# Patient Record
Sex: Female | Born: 1980
Health system: Southern US, Community
[De-identification: ages and names within clinical notes are randomized; demographics above are authoritative.]

## PROBLEM LIST (undated history)

## (undated) DIAGNOSIS — F329 Major depressive disorder, single episode, unspecified: Secondary | ICD-10-CM

## (undated) DIAGNOSIS — IMO0002 Reserved for concepts with insufficient information to code with codable children: Secondary | ICD-10-CM

## (undated) DIAGNOSIS — K8020 Calculus of gallbladder without cholecystitis without obstruction: Secondary | ICD-10-CM

## (undated) DIAGNOSIS — Z8659 Personal history of other mental and behavioral disorders: Secondary | ICD-10-CM

## (undated) DIAGNOSIS — K219 Gastro-esophageal reflux disease without esophagitis: Secondary | ICD-10-CM

## (undated) DIAGNOSIS — R198 Other specified symptoms and signs involving the digestive system and abdomen: Secondary | ICD-10-CM

## (undated) DIAGNOSIS — F32A Depression, unspecified: Secondary | ICD-10-CM

## (undated) DIAGNOSIS — Z8719 Personal history of other diseases of the digestive system: Secondary | ICD-10-CM

## (undated) HISTORY — DX: Personal history of other mental and behavioral disorders: Z86.59

## (undated) HISTORY — PX: WISDOM TOOTH EXTRACTION: SHX21

## (undated) HISTORY — DX: Reserved for concepts with insufficient information to code with codable children: IMO0002

## (undated) HISTORY — DX: Other specified symptoms and signs involving the digestive system and abdomen: R19.8

## (undated) HISTORY — DX: Personal history of other diseases of the digestive system: Z87.19

## (undated) HISTORY — DX: Gastro-esophageal reflux disease without esophagitis: K21.9

---
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
## 2006-10-31 DIAGNOSIS — R87619 Unspecified abnormal cytological findings in specimens from cervix uteri: Secondary | ICD-10-CM

## 2006-10-31 DIAGNOSIS — IMO0002 Reserved for concepts with insufficient information to code with codable children: Secondary | ICD-10-CM

## 2006-10-31 HISTORY — DX: Unspecified abnormal cytological findings in specimens from cervix uteri: R87.619

## 2006-10-31 HISTORY — DX: Reserved for concepts with insufficient information to code with codable children: IMO0002

## 2009-05-13 ENCOUNTER — Ambulatory Visit: Payer: Self-pay | Admitting: Family Medicine

## 2009-05-15 ENCOUNTER — Encounter: Payer: Self-pay | Admitting: Family Medicine

## 2011-04-23 ENCOUNTER — Emergency Department (INDEPENDENT_AMBULATORY_CARE_PROVIDER_SITE_OTHER): Payer: No Typology Code available for payment source

## 2011-04-23 ENCOUNTER — Emergency Department (HOSPITAL_BASED_OUTPATIENT_CLINIC_OR_DEPARTMENT_OTHER)
Admission: EM | Admit: 2011-04-23 | Discharge: 2011-04-23 | Disposition: A | Discharge status: Home / Self Care | Payer: No Typology Code available for payment source | Attending: Emergency Medicine | Admitting: Emergency Medicine

## 2011-04-23 DIAGNOSIS — S335XXA Sprain of ligaments of lumbar spine, initial encounter: Secondary | ICD-10-CM | POA: Insufficient documentation

## 2011-04-23 DIAGNOSIS — X58XXXA Exposure to other specified factors, initial encounter: Secondary | ICD-10-CM | POA: Insufficient documentation

## 2011-04-23 DIAGNOSIS — M79609 Pain in unspecified limb: Secondary | ICD-10-CM

## 2011-04-23 DIAGNOSIS — M545 Low back pain, unspecified: Secondary | ICD-10-CM

## 2012-08-28 ENCOUNTER — Encounter: Payer: Self-pay | Admitting: Emergency Medicine

## 2012-08-28 ENCOUNTER — Emergency Department
Admission: EM | Admit: 2012-08-28 | Discharge: 2012-08-28 | Disposition: A | Discharge status: Home / Self Care | Payer: Self-pay | Source: Home / Self Care | Attending: Family Medicine | Admitting: Family Medicine

## 2012-08-28 DIAGNOSIS — M940 Chondrocostal junction syndrome [Tietze]: Secondary | ICD-10-CM

## 2012-08-28 HISTORY — DX: Calculus of gallbladder without cholecystitis without obstruction: K80.20

## 2012-08-28 LAB — POCT CBC W AUTO DIFF (K'VILLE URGENT CARE)

## 2012-08-28 MED ORDER — NAPROXEN 500 MG PO TABS: 500.0000 mg | ORAL_TABLET | Freq: Two times a day (BID) | ORAL | Status: DC

## 2012-08-28 NOTE — ED Provider Notes (Signed)
History     CSN: [id number]  Arrival date & time 08/28/12  1157   First MD Initiated Contact with Patient 08/28/12 1234      Chief Complaint  Patient presents with  . GI Problem     HPI Comments: Patient states she has gall stones and feels like she has gas trapped under her left ribs since yesterday afternoon, no relief with tums or pepcid.  Her pain is constant and sharp, somewhat worse with inspiration.  She has had no nausea/vomiting.  Bowel movements are normal. The pain does not radiate.  She had a similar episode one year ago and EKG and chest X-ray were normal.  Patient is a 31 y.o. female presenting with abdominal pain. The history is provided by the patient.  Abdominal Pain The primary symptoms of the illness include abdominal pain. The primary symptoms of the illness do not include fever, fatigue, shortness of breath, nausea, vomiting, diarrhea, hematemesis, hematochezia, dysuria, vaginal discharge or vaginal bleeding. The current episode started yesterday. The onset of the illness was sudden.  Associated with: nothing. The patient states that she believes she is currently not pregnant. The patient has not had a change in bowel habit. Symptoms associated with the illness do not include chills, anorexia, diaphoresis, heartburn, constipation, urgency, hematuria, frequency or back pain.    Past Medical History  Diagnosis Date  . Gall stones     History reviewed. No pertinent past surgical history.  Family History  Problem Relation Age of Onset  . Migraines Mother   . Diverticulitis Mother     History  Substance Use Topics  . Smoking status: Never Smoker   . Smokeless tobacco: Not on file  . Alcohol Use: No    OB History    Grav Para Term Preterm Abortions TAB SAB Ect Mult Living                  Review of Systems  Constitutional: Negative for fever, chills, diaphoresis and fatigue.  Respiratory: Negative for shortness of breath.   Gastrointestinal: Positive  for abdominal pain. Negative for heartburn, nausea, vomiting, diarrhea, constipation, hematochezia, anorexia and hematemesis.  Genitourinary: Negative for dysuria, urgency, frequency, hematuria, vaginal bleeding and vaginal discharge.  Musculoskeletal: Negative for back pain.  All other systems reviewed and are negative.    Allergies  Review of patient's allergies indicates no known allergies.  Home Medications   Current Outpatient Rx  Name Route Sig Dispense Refill  . CALCIUM CARBONATE ANTACID 500 MG PO CHEW Oral Chew 1 tablet by mouth daily.    Marland Kitchen FAMOTIDINE 10 MG PO CHEW Oral Chew 10 mg by mouth 2 (two) times daily.    Marland Kitchen NAPROXEN 500 MG PO TABS Oral Take 1 tablet (500 mg total) by mouth 2 (two) times daily. (every 12 hours with food) 20 tablet 1    BP 148/84  Pulse 87  Temp 98 F (36.7 C) (Oral)  Resp 16  Ht 5\' 10"  (1.778 m)  Wt 252 lb (114.306 kg)  BMI 36.16 kg/m2  SpO2 99%  Physical Exam Nursing notes and Vital Signs reviewed. Appearance:  Patient appears stated age, and in no acute distress.  Patient is obese (BMI 36.2) Eyes:  Pupils are equal, round, and reactive to light and accomodation.  Extraocular movement is intact.  Conjunctivae are not inflamed  Ears:  Canals normal.  Tympanic membranes normal.  Nose:  Mildly congested turbinates.  No sinus tenderness.   Pharynx:  Normal Neck:  Supple.  No adenopathy Lungs:  Clear to auscultation.  Breath sounds are equal.  Chest:  Distinct tenderness to palpation over the lower sternum and Xiphoid process, extending laterally to left lower anterior rib.  Heart:  Regular rate and rhythm without murmurs, rubs, or gallops.  Abdomen:  Nontender without masses or hepatosplenomegaly.  Bowel sounds are present.  No CVA or flank tenderness.  Extremities:  No edema.  No calf tenderness Skin:  No rash present.   ED Course  Procedures none   Labs Reviewed  POCT CBC W AUTO DIFF ([facility])   WBC 7.6; LY 39.8; MO 5.6; GR  54.6; Hgb 13.6; Platelets 338       1. Costochondritis, acute.  Sudden onset costochondritis could represent and early viral URI       MDM  Rx for Naproxen Apply ice pack several times daily. Given a Water quality scientist patient information and instruction sheet on topic  If cold symptoms develop, take Mucinex D (guaifenesin with decongestant) twice daily for congestion.  Increase fluid intake, rest. May use Afrin nasal spray (or generic oxymetazoline) twice daily for about 5 days.  Also recommend using saline nasal spray several times daily and saline nasal irrigation (AYR is a common brand) Stop all antihistamines for now, and other non-prescription cough/cold preparations. Follow-up with family doctor if not improving 7 to 10 days.          Lattie Haw, MD 08/29/12 [phone]

## 2012-08-28 NOTE — ED Notes (Signed)
Patient states she has gall stones and feels like she has gas trapped under her left ribs since yesterday afternoon, no relief with tums or pepcid

## 2012-08-30 ENCOUNTER — Telehealth: Payer: Self-pay | Admitting: *Deleted

## 2013-01-08 ENCOUNTER — Ambulatory Visit (INDEPENDENT_AMBULATORY_CARE_PROVIDER_SITE_OTHER): Payer: PRIVATE HEALTH INSURANCE | Admitting: Family Medicine

## 2013-01-08 ENCOUNTER — Encounter: Payer: Self-pay | Admitting: Family Medicine

## 2013-01-08 VITALS — BP 136/86 | HR 76 | Ht 69.75 in | Wt 248.0 lb

## 2013-01-08 DIAGNOSIS — K219 Gastro-esophageal reflux disease without esophagitis: Secondary | ICD-10-CM

## 2013-01-08 DIAGNOSIS — R1013 Epigastric pain: Secondary | ICD-10-CM

## 2013-01-08 DIAGNOSIS — R635 Abnormal weight gain: Secondary | ICD-10-CM

## 2013-01-08 DIAGNOSIS — R198 Other specified symptoms and signs involving the digestive system and abdomen: Secondary | ICD-10-CM

## 2013-01-08 DIAGNOSIS — Z8719 Personal history of other diseases of the digestive system: Secondary | ICD-10-CM

## 2013-01-08 DIAGNOSIS — G8929 Other chronic pain: Secondary | ICD-10-CM

## 2013-01-08 HISTORY — DX: Personal history of other diseases of the digestive system: Z87.19

## 2013-01-08 HISTORY — DX: Other specified symptoms and signs involving the digestive system and abdomen: R19.8

## 2013-01-08 MED ORDER — DEXLANSOPRAZOLE 60 MG PO CPDR: 60.0000 mg | DELAYED_RELEASE_CAPSULE | Freq: Every day | ORAL | Status: DC

## 2013-01-08 NOTE — Patient Instructions (Addendum)
Psyllium powder for bowel irregularity: 3 heaping TEASPOONS spread out over the day for two weeks,continue beyond this if helping "IBS" symptoms. Start with one teaspoon a day, then two, then three. Phentermine tablets or capsules What is this medicine? PHENTERMINE (FEN ter meen) decreases your appetite. It is used with a reduced calorie diet and exercise to help you lose weight. This medicine may be used for other purposes; ask your health care Jeryn Bertoni or pharmacist if you have questions. What should I tell my health care Yardley Beltran before I take this medicine? They need to know if you have any of these conditions: -agitation -glaucoma -heart disease -high blood pressure -history of substance abuse -lung disease called Primary Pulmonary Hypertension (PPH) -taken an MAOI like Carbex, Eldepryl, Marplan, Nardil, or Parnate in last 14 days -thyroid disease -an unusual or allergic reaction to phentermine, other medicines, foods, dyes, or preservatives -pregnant or trying to get pregnant -breast-feeding How should I use this medicine? Take this medicine by mouth with a glass of water. Follow the directions on the prescription label. This medicine is usually taken 30 minutes before or 1 to 2 hours after breakfast. Avoid taking this medicine in the evening. It may interfere with sleep. Take your doses at regular intervals. Do not take your medicine more often than directed. Talk to your pediatrician regarding the use of this medicine in children. Special care may be needed. Overdosage: If you think you have taken too much of this medicine contact a poison control center or emergency room at once. NOTE: This medicine is only for you. Do not share this medicine with others. What if I miss a dose? If you miss a dose, take it as soon as you can. If it is almost time for your next dose, take only that dose. Do not take double or extra doses. What may interact with this medicine? Do not take this medicine  with any of the following medications: -duloxetine -MAOIs like Carbex, Eldepryl, Marplan, Nardil, and Parnate -medicines for colds or breathing difficulties like pseudoephedrine or phenylephrine -procarbazine -sibutramine -SSRIs like citalopram, escitalopram, fluoxetine, fluvoxamine, paroxetine, and sertraline -stimulants like dexmethylphenidate, methylphenidate or modafinil -venlafaxine This medicine may also interact with the following medications: -medicines for diabetes This list may not describe all possible interactions. Give your health care Rebekah Sprinkle a list of all the medicines, herbs, non-prescription drugs, or dietary supplements you use. Also tell them if you smoke, drink alcohol, or use illegal drugs. Some items may interact with your medicine. What should I watch for while using this medicine? Notify your physician immediately if you become short of breath while doing your normal activities. Do not take this medicine within 6 hours of bedtime. It can keep you from getting to sleep. Avoid drinks that contain caffeine and try to stick to a regular bedtime every night. This medicine was intended to be used in addition to a healthy diet and exercise. The best results are achieved this way. This medicine is only indicated for short-term use. Eventually your weight loss may level out. At that point, the drug will only help you maintain your new weight. Do not increase or in any way change your dose without consulting your doctor. You may get drowsy or dizzy. Do not drive, use machinery, or do anything that needs mental alertness until you know how this medicine affects you. Do not stand or sit up quickly, especially if you are an older patient. This reduces the risk of dizzy or fainting spells. Alcohol may  increase dizziness and drowsiness. Avoid alcoholic drinks. What side effects may I notice from receiving this medicine? Side effects that you should report to your doctor or health care  professional as soon as possible: -chest pain, palpitations -depression or severe changes in mood -increased blood pressure -irritability -nervousness or restlessness -severe dizziness -shortness of breath -problems urinating -unusual swelling of the legs -vomiting Side effects that usually do not require medical attention (report to your doctor or health care professional if they continue or are bothersome): -blurred vision or other eye problems -changes in sexual ability or desire -constipation or diarrhea -difficulty sleeping -dry mouth or unpleasant taste -headache -nausea This list may not describe all possible side effects. Call your doctor for medical advice about side effects. You may report side effects to FDA at 1-800-FDA-1088. Where should I keep my medicine? Keep out of the reach of children. This medicine can be abused. Keep your medicine in a safe place to protect it from theft. Do not share this medicine with anyone. Selling or giving away this medicine is dangerous and against the law. Store at room temperature between 20 and 25 degrees C (68 and 77 degrees F). Keep container tightly closed. Throw away any unused medicine after the expiration date. NOTE: This sheet is a summary. It may not cover all possible information. If you have questions about this medicine, talk to your doctor, pharmacist, or health care Antwione Picotte.  2012, [employer]. (12/01/2010 11:02:44 AM)

## 2013-01-08 NOTE — Progress Notes (Signed)
CC: Katherine [name] is a 32 y.o. female is here for Establish Care, weight loss issues and possible reflux   Subjective: HPI:  Katherine [name] 32 year old here to establish care  Patient complains of inability to significantly lose weight ever since November of 2013. At that time she started Weight Watchers and has been following with strict adherence and reports only losing 3 pounds. Prior to this regimen she was eating out 5 nights a week. She has a limited fast foods and fried foods, eating a diet heavy in vegetables, fruits, and lean meat. Her inability to lose weight has been persistent since November, nothing seems to make this inability better or worse, she tries to stay active but no formal exercise program. She has never had this problem before. Mother has hypothyroidism.   Patient complains of over 3 months of epigastric pain. It is present most days of the week and described as a mild to moderate burning that has a burning radiation of the back of her sternum. It is worse with dairy products. It is slightly improved with TUMS but only temporarily. She denies right upper quadrant pain or right scapular pain, however she has had this before which prompted a ED visit when they found gallstones. Symptoms occur on a daily basis.  Patient complains of bowel irregularities that have been present for 6-12 months. She describes a unpredictable three-day cycle of constipation followed by 3 days of loose stools which may or may not repeat itself the following 3-6 days. It is associated with bloating in the abdomen localized low in the abdomen that is relieved with defecation. She denies nausea or vomiting prior during or after the above cycles. She denies unintentional weight loss nor waking with abdominal pain. She denies light stools nor tar-like stools. No interventions as of yet. Symptoms are described as moderate with respect to interfering with quality of life. Symptoms can occur after eating any  type of food or even with dietary restrictions from dairy or vegetables. Nothing seems to make symptoms better or worse.  Review of Systems - General ROS: negative for - chills, fever, night sweats, Ophthalmic ROS: negative for - decreased vision Psychological ROS: negative for - anxiety or depression ENT ROS: negative for - hearing change, nasal congestion, tinnitus or allergies Hematological and Lymphatic ROS: negative for - bleeding problems, bruising or swollen lymph nodes Breast ROS: negative Respiratory ROS: no cough, shortness of breath, or wheezing Cardiovascular ROS: no chest pain or dyspnea on exertion Genito-Urinary ROS: negative for - genital discharge, genital ulcers, incontinence or abnormal bleeding from genitals Musculoskeletal ROS: negative for - joint pain or muscle pain Neurological ROS: negative for - headaches or memory loss Dermatological ROS: negative for lumps, mole changes, rash and skin lesion changes  Past Medical History  Diagnosis Date  . Gall stones   . History of gallstones 01/08/2013     Family History  Problem Relation Age of Onset  . Migraines Mother   . Diverticulitis Mother   . Alcoholism      grandparent  . Lung cancer      grandparent  . Depression Mother   . Diabetes      aunt  . Hyperlipidemia Mother   . Hypertension Mother   . Stroke      greatgrandmother     History  Substance Use Topics  . Smoking status: Former Games developer  . Smokeless tobacco: Not on file  . Alcohol Use: No     Objective: Filed Vitals:  01/08/13 1423  BP: 136/86  Pulse: 76    General: Alert and Oriented, No Acute Distress HEENT: Pupils equal, round, reactive to light. Conjunctivae clear.  External ears unremarkable, canals clear with intact TMs with appropriate landmarks.  Middle ear appears open without effusion. Pink inferior turbinates.  Moist mucous membranes, pharynx without inflammation nor lesions.  Neck supple without palpable lymphadenopathy nor  abnormal masses. Lungs: Clear to auscultation bilaterally, no wheezing/ronchi/rales.  Comfortable work of breathing. Good air movement. Cardiac: Regular rate and rhythm. Normal S1/S2.  No murmurs, rubs, nor gallops.   Abdomen: Normal bowel sounds, soft and no palpable masses nor hepatosplenomegaly, epigastric pain is reproduced with moderate palpation just below the xiphoid process, no pain elsewhere in the abdomen  Extremities: No peripheral edema.  Strong peripheral pulses.  Mental Status: No depression, anxiety, nor agitation. Skin: Warm and dry.  Assessment & Plan: Carren was seen today for establish care, weight loss issues and possible reflux.  Diagnoses and associated orders for this visit:  History of gallstones  Weight gain - TSH - T4, free - T3, free - COMPLETE METABOLIC PANEL WITH GFR  Alternating constipation and diarrhea  Abdominal pain, chronic, epigastric - H. pylori antibody, IgG - COMPLETE METABOLIC PANEL WITH GFR  GERD (gastroesophageal reflux disease) - dexlansoprazole (DEXILANT) 60 MG capsule; Take 1 capsule (60 mg total) by mouth daily.    Weight gain: Will rule out thyroid abnormality or metabolic abnormalities, if normal patient is interested in considering phentermine or Topamax and will return to discuss risks and benefits Alternating constipation and diarrhea: Discussed suspicion for irritable bowel syndrome therefore start psyllum supplementation for increased fiber intake.  Epigastric abdominal pain: High suspicion for GERD, patient provided with samples of PPI for therapeutic and diagnostic evaluation and also checking hpylori antigen   Return in about 2 weeks (around 01/22/2013).

## 2013-01-09 ENCOUNTER — Encounter: Payer: Self-pay | Admitting: Family Medicine

## 2013-01-09 LAB — TSH: TSH: 3.377 u[IU]/mL (ref 0.350–4.500)

## 2013-01-09 LAB — COMPLETE METABOLIC PANEL WITH GFR
AST: 14 U/L (ref 0–37)
Albumin: 4.5 g/dL (ref 3.5–5.2)
Alkaline Phosphatase: 73 U/L (ref 39–117)
Potassium: 4.2 mEq/L (ref 3.5–5.3)
Sodium: 138 mEq/L (ref 135–145)
Total Bilirubin: 0.4 mg/dL (ref 0.3–1.2)
Total Protein: 6.9 g/dL (ref 6.0–8.3)

## 2013-01-09 LAB — T3, FREE: T3, Free: 2.9 pg/mL (ref 2.3–4.2)

## 2013-01-09 LAB — H. PYLORI ANTIBODY, IGG: H Pylori IgG: <0.4 {ISR}

## 2013-01-22 ENCOUNTER — Encounter: Payer: Self-pay | Admitting: Family Medicine

## 2013-01-22 ENCOUNTER — Ambulatory Visit (INDEPENDENT_AMBULATORY_CARE_PROVIDER_SITE_OTHER): Payer: PRIVATE HEALTH INSURANCE | Admitting: Family Medicine

## 2013-01-22 VITALS — BP 135/85 | HR 80 | Wt 253.0 lb

## 2013-01-22 DIAGNOSIS — R198 Other specified symptoms and signs involving the digestive system and abdomen: Secondary | ICD-10-CM

## 2013-01-22 DIAGNOSIS — E669 Obesity, unspecified: Secondary | ICD-10-CM | POA: Insufficient documentation

## 2013-01-22 DIAGNOSIS — R030 Elevated blood-pressure reading, without diagnosis of hypertension: Secondary | ICD-10-CM

## 2013-01-22 DIAGNOSIS — K219 Gastro-esophageal reflux disease without esophagitis: Secondary | ICD-10-CM

## 2013-01-22 DIAGNOSIS — IMO0001 Reserved for inherently not codable concepts without codable children: Secondary | ICD-10-CM

## 2013-01-22 DIAGNOSIS — R635 Abnormal weight gain: Secondary | ICD-10-CM

## 2013-01-22 HISTORY — DX: Gastro-esophageal reflux disease without esophagitis: K21.9

## 2013-01-22 MED ORDER — PHENTERMINE HCL 37.5 MG PO CAPS: 37.5000 mg | ORAL_CAPSULE | ORAL | Status: DC

## 2013-01-22 NOTE — Progress Notes (Signed)
CC: Katherine [name] is a 32 y.o. female is here for GERD f/u and discuss labs   Subjective: HPI:  Followup obesity and weight gain: Patient reports continued trouble with losing weight despite using weight watchers and physical activity. Recent labs did not show metabolic abnormalities. She has been doing research on phentermine after our last discussion and has an interest. Nothing seems to help with the weight loss, seems to be worse with lack of portion control. Trouble has been present for years.  Followup epigastric pain: There was a suspicion for GERD and she was started on dexilant. She reports complete resolution of her symptoms soon after starting dexilant. If she misses a day or 2 dosing symptoms slowly returned. She is quite happy with the response.  Followup diarrhea and constipation: Since starting 2 teaspoons of psyllum powder on a daily basis she reports predictable and consistent single nonpainful well formed bowel movements. She tells me her initial complaints are gone.  Pointed out the patient that she has had pre-hypertension at her past visits and today. She informs me she is been told this before and takes her blood pressure regularly at home with a biceps cuff. She reports consistent blood pressures below 130/80  She denies chest pain, shortness of breath, motor sensory disturbances, mental disturbance, irregular heartbeat  Review Of Systems Outlined In HPI  Past Medical History  Diagnosis Date  . Gall stones   . History of gallstones 01/08/2013  . GERD (gastroesophageal reflux disease) 01/22/2013  . Alternating constipation and diarrhea 01/08/2013     Family History  Problem Relation Age of Onset  . Migraines Mother   . Diverticulitis Mother   . Alcoholism      grandparent  . Lung cancer      grandparent  . Depression Mother   . Diabetes      aunt  . Hyperlipidemia Mother   . Hypertension Mother   . Stroke      greatgrandmother     History   Substance Use Topics  . Smoking status: Former Games developer  . Smokeless tobacco: Not on file  . Alcohol Use: No     Objective: Filed Vitals:   01/22/13 1559  BP: 135/85  Pulse: 80    Vital signs reviewed. General: Alert and Oriented, No Acute Distress HEENT: Pupils equal, round, reactive to light. Conjunctivae clear.  External ears unremarkable.  Moist mucous membranes. Lungs: Clear and comfortable work of breathing, speaking in full sentences without accessory muscle use. No wheezing rhonchi nor rales Cardiac: Regular rate and rhythm. No murmurs Neuro: CN II-XII grossly intact, gait normal. Extremities: No peripheral edema.  Strong peripheral pulses.  Mental Status: No depression, anxiety, nor agitation. Logical though process. Skin: Warm and dry.  Assessment & Plan: Greydis was seen today for gerd f/u and discuss labs.  Diagnoses and associated orders for this visit:  Weight gain  Alternating constipation and diarrhea  Obesity - phentermine 37.5 MG capsule; Take 1 capsule (37.5 mg total) by mouth every morning.  GERD (gastroesophageal reflux disease)  Elevated BP    Weight gain and obesity: Uncontrolled, discussed risk and benefits of phentermine use. She will start and return in 4 months for blood pressure and weight check   alternating constipation and diarrhea: Suspicion of IBS now resolved and controlled on psyllum powder continue current regimen GERD: Controlled, will need to switch to other PPI due to cost issues, she will notify me which one is most financially reasonable Elevated BP: Discussed that  we may need to stop phentermine in the future if this is worsened, she agrees to take blood pressures at home frequently notify me if greater than 140/90 bring in blood pressure cuff next visit  25 minutes spent face-to-face during visit today of which at least 50% was counseling or coordinating care regarding weight gain and obesity, GERD, elevated BP, IBS.   Return  in about 4 weeks (around 02/19/2013).

## 2013-01-22 NOTE — Patient Instructions (Signed)
Dexilant, Protonix (Pantoprazole), Nexium (Esomeprazole), Aciphex (Rabeprazole), Prevacid (Lansoprazole)

## 2013-01-28 ENCOUNTER — Telehealth: Payer: Self-pay | Admitting: Family Medicine

## 2013-01-28 ENCOUNTER — Encounter: Payer: Self-pay | Admitting: Family Medicine

## 2013-01-28 DIAGNOSIS — K219 Gastro-esophageal reflux disease without esophagitis: Secondary | ICD-10-CM

## 2013-01-28 MED ORDER — PANTOPRAZOLE SODIUM 40 MG PO TBEC: 40.0000 mg | DELAYED_RELEASE_TABLET | Freq: Every day | ORAL | Status: DC

## 2013-01-28 NOTE — Telephone Encounter (Signed)
Katherine [name], Will you please let Katherine [name] know I sent in protonix to her [facility].

## 2013-01-28 NOTE — Telephone Encounter (Signed)
Pt.notified

## 2013-02-19 ENCOUNTER — Ambulatory Visit (INDEPENDENT_AMBULATORY_CARE_PROVIDER_SITE_OTHER): Payer: PRIVATE HEALTH INSURANCE | Admitting: Family Medicine

## 2013-02-19 ENCOUNTER — Encounter: Payer: Self-pay | Admitting: Family Medicine

## 2013-02-19 VITALS — BP 133/84 | HR 79 | Wt 239.0 lb

## 2013-02-19 DIAGNOSIS — IMO0001 Reserved for inherently not codable concepts without codable children: Secondary | ICD-10-CM

## 2013-02-19 DIAGNOSIS — K219 Gastro-esophageal reflux disease without esophagitis: Secondary | ICD-10-CM

## 2013-02-19 DIAGNOSIS — R030 Elevated blood-pressure reading, without diagnosis of hypertension: Secondary | ICD-10-CM

## 2013-02-19 DIAGNOSIS — E669 Obesity, unspecified: Secondary | ICD-10-CM

## 2013-02-19 MED ORDER — PHENTERMINE HCL 37.5 MG PO CAPS: 37.5000 mg | ORAL_CAPSULE | ORAL | Status: DC

## 2013-02-19 NOTE — Progress Notes (Signed)
CC: Katherine [name] is a 32 y.o. female is here for Weight Check and Blood Pressure Check   Subjective: HPI:  Followup obesity: Has started phentermine on a daily basis without any anxiety, sleep disturbance, anorexia, mental disturbance, tremor, irregular heartbeat, nor chest pain. Has successfully lost 14 pounds with portion control and keeping a food diary on my fitness pal.  Husband is helping out as well, he has lost 15 pounds without phentermine use.  Followup elevated blood pressure: She has had her blood pressure taken at her office and reports consistent blood pressures below 120/80 since starting phentermine. Denies motor or sensory disturbances, chest pain, orthopnea, peripheral edema.  Followup GERD: Continues on dexilant samples without any discomfort in the throat, abdomen, esophagus. Continues to feel that GERD symptoms from the past are 100% absent.   Review Of Systems Outlined In HPI  Past Medical History  Diagnosis Date  . Gall stones   . History of gallstones 01/08/2013  . GERD (gastroesophageal reflux disease) 01/22/2013  . Alternating constipation and diarrhea 01/08/2013     Family History  Problem Relation Age of Onset  . Migraines Mother   . Diverticulitis Mother   . Alcoholism      grandparent  . Lung cancer      grandparent  . Depression Mother   . Diabetes      aunt  . Hyperlipidemia Mother   . Hypertension Mother   . Stroke      greatgrandmother     History  Substance Use Topics  . Smoking status: Former Games developer  . Smokeless tobacco: Not on file  . Alcohol Use: No     Objective: Filed Vitals:   02/19/13 1619  BP: 133/84  Pulse: 79    Vital signs reviewed. General: Alert and Oriented, No Acute Distress HEENT: Pupils equal, round, reactive to light. Conjunctivae clear.  External ears unremarkable.  Moist mucous membranes. Lungs: Clear and comfortable work of breathing, speaking in full sentences without accessory muscle use. No  wheezing rhonchi nor rales Cardiac: Regular rate and rhythm. No murmurs rubs or gallops Neuro: CN II-XII grossly intact, gait normal. Extremities: No peripheral edema.  Strong peripheral pulses.  Mental Status: No depression, anxiety, nor agitation. Logical though process. Skin: Warm and dry.  Assessment & Plan: Katherine [name] was seen today for weight check and blood pressure check.  Diagnoses and associated orders for this visit:  Obesity - phentermine 37.5 MG capsule; Take 1 capsule (37.5 mg total) by mouth every morning.  GERD (gastroesophageal reflux disease)  Elevated BP    Obesity: Improving continue phentermine use and calorie counting GERD: Improved and stable, continue dexilant and start Protonix once out Elevated blood pressure: Well-controlled now with diet and exercise interventions, no formal diagnosis of essential hypertension  Return in about 4 weeks (around 03/19/2013).

## 2013-03-22 ENCOUNTER — Encounter: Payer: Self-pay | Admitting: Family Medicine

## 2013-03-22 ENCOUNTER — Ambulatory Visit: Payer: PRIVATE HEALTH INSURANCE | Admitting: Family Medicine

## 2013-03-22 ENCOUNTER — Ambulatory Visit (INDEPENDENT_AMBULATORY_CARE_PROVIDER_SITE_OTHER): Payer: PRIVATE HEALTH INSURANCE | Admitting: Family Medicine

## 2013-03-22 VITALS — BP 117/70 | HR 92 | Wt 225.0 lb

## 2013-03-22 DIAGNOSIS — K219 Gastro-esophageal reflux disease without esophagitis: Secondary | ICD-10-CM

## 2013-03-22 DIAGNOSIS — E669 Obesity, unspecified: Secondary | ICD-10-CM

## 2013-03-22 MED ORDER — PHENTERMINE HCL 37.5 MG PO CAPS: 37.5000 mg | ORAL_CAPSULE | ORAL | Status: DC

## 2013-03-22 NOTE — Progress Notes (Signed)
CC: Katherine [name] is a 32 y.o. female is here for f/u wt wt loss   Subjective: HPI:  Followup obesity: Continues to exercise most days of the week, successful with portion control, no longer snacking as much. She is pleased with her weight loss. Denies irregular heartbeat, chest pain, paranoia, insomnia, mental disturbance, tremor.  Followup GERD: Switch to Protonix at her last visit for financial reasons. Denies epigastric pain, abdominal pain, GI disturbance.     Review Of Systems Outlined In HPI  Past Medical History  Diagnosis Date  . Gall stones   . History of gallstones 01/08/2013  . GERD (gastroesophageal reflux disease) 01/22/2013  . Alternating constipation and diarrhea 01/08/2013     Family History  Problem Relation Age of Onset  . Migraines Mother   . Diverticulitis Mother   . Alcoholism      grandparent  . Lung cancer      grandparent  . Depression Mother   . Diabetes      aunt  . Hyperlipidemia Mother   . Hypertension Mother   . Stroke      greatgrandmother     History  Substance Use Topics  . Smoking status: Former Games developer  . Smokeless tobacco: Not on file  . Alcohol Use: No     Objective: Filed Vitals:   03/22/13 0822  BP: 117/70  Pulse: 92    Vital signs reviewed. General: Alert and Oriented, No Acute Distress HEENT: Pupils equal, round, reactive to light. Conjunctivae clear.  External ears unremarkable.  Moist mucous membranes. Lungs: Clear and comfortable work of breathing, speaking in full sentences without accessory muscle use. Cardiac: Regular rate and rhythm.  No murmurs rubs or gallops Neuro: CN II-XII grossly intact, gait normal. Extremities: No peripheral edema.  Strong peripheral pulses.  Mental Status: No depression, anxiety, nor agitation. Logical though process. Skin: Warm and dry.  Assessment & Plan: Katherine [name] was seen today for f/u wt wt loss.  Diagnoses and associated orders for this visit:  Obesity - phentermine 37.5  MG capsule; Take 1 capsule (37.5 mg total) by mouth every morning.  GERD (gastroesophageal reflux disease)    Obesity: Improving, applauded her lifestyle and diet interventions, continue phentermine GERD: Controlled, continue Protonix  Return in about 4 weeks (around 04/19/2013) for weight check.

## 2013-04-26 ENCOUNTER — Encounter: Payer: Self-pay | Admitting: Family Medicine

## 2013-04-26 ENCOUNTER — Ambulatory Visit (INDEPENDENT_AMBULATORY_CARE_PROVIDER_SITE_OTHER): Payer: PRIVATE HEALTH INSURANCE | Admitting: Family Medicine

## 2013-04-26 VITALS — BP 118/85 | HR 84 | Temp 97.9°F | Resp 18 | Wt 217.0 lb

## 2013-04-26 DIAGNOSIS — E669 Obesity, unspecified: Secondary | ICD-10-CM

## 2013-04-26 MED ORDER — PHENTERMINE HCL 37.5 MG PO CAPS: 37.5000 mg | ORAL_CAPSULE | ORAL | Status: DC

## 2013-04-26 NOTE — Progress Notes (Signed)
CC: Katherine [name] is a 32 y.o. female is here for Weight Check   Subjective: HPI:  Followup obesity: Patient reports subjective improvement with weight loss, she reports sustained improvement with portion control and healthy food choices. She is engaging in exercise most days of the week. She is trying to eat healthier less fatty foods and lower glycemic index  Foods.  She reports success with decreased appetite. Portion control and appetite habits are worsened when she's menstruating Or on the weekends, these are improved when she gets into her usual daily routine. She denies anxiety, tremor, mental disturbance, sleep disturbance, insomnia rapid heartbeat or chest pain.   Review Of Systems Outlined In HPI  Past Medical History  Diagnosis Date  . Gall stones   . History of gallstones 01/08/2013  . GERD (gastroesophageal reflux disease) 01/22/2013  . Alternating constipation and diarrhea 01/08/2013     Family History  Problem Relation Age of Onset  . Migraines Mother   . Diverticulitis Mother   . Alcoholism      grandparent  . Lung cancer      grandparent  . Depression Mother   . Diabetes      aunt  . Hyperlipidemia Mother   . Hypertension Mother   . Stroke      greatgrandmother     History  Substance Use Topics  . Smoking status: Former Games developer  . Smokeless tobacco: Not on file  . Alcohol Use: No     Objective: Filed Vitals:   04/26/13 0926  BP: 118/85  Pulse: 84  Temp: 97.9 F (36.6 C)  Resp: 18    Vital signs reviewed. General: Alert and Oriented, No Acute Distress HEENT: Pupils equal, round, reactive to light. Conjunctivae clear.  External ears unremarkable.  Moist mucous membranes. Lungs: Clear and comfortable work of breathing, speaking in full sentences without accessory muscle use. Cardiac: Regular rate and rhythm.  Neuro: CN II-XII grossly intact, gait normal. Extremities: No peripheral edema.  Strong peripheral pulses.  Mental Status: No  depression, anxiety, nor agitation. Logical though process. Skin: Warm and dry. Assessment & Plan: Katherine [name] was seen today for weight check.  Diagnoses and associated orders for this visit:  Obesity - phentermine 37.5 MG capsule; Take 1 capsule (37.5 mg total) by mouth every morning.    Obesity: Controlled and improving, blood pressure has also improved since first visit, continue phentermine congratulated her on diet and exercise interventions that she's been sticking to  Return in about 4 weeks (around 05/24/2013).

## 2013-05-23 ENCOUNTER — Ambulatory Visit (INDEPENDENT_AMBULATORY_CARE_PROVIDER_SITE_OTHER): Payer: PRIVATE HEALTH INSURANCE | Admitting: Family Medicine

## 2013-05-23 ENCOUNTER — Encounter: Payer: Self-pay | Admitting: Family Medicine

## 2013-05-23 VITALS — BP 117/79 | HR 78 | Wt 217.0 lb

## 2013-05-23 DIAGNOSIS — E669 Obesity, unspecified: Secondary | ICD-10-CM

## 2013-05-23 MED ORDER — PHENTERMINE HCL 37.5 MG PO CAPS: 37.5000 mg | ORAL_CAPSULE | ORAL | Status: DC

## 2013-05-23 NOTE — Progress Notes (Signed)
CC: Katherine [name] is a 32 y.o. female is here for bp and wt check   Subjective: HPI:   Followup obesity: Patient stopped phentermine about 3 weeks ago for 2 weeks to see if she could maintain her weight this was after gaining 6 pounds after going to the beach or on fourth of July. Over this 2 weeks there was no weight change, she restarted phentermine was able to lose to 6 pounds she gained earlier in the month. She's been unable to work out due to job responsibilities, she continues to focus on portion control and wise eating. She denies irregular heartbeat, chest pain, mental disturbance, anxiety, sleep disturbance  Review Of Systems Outlined In HPI  Past Medical History  Diagnosis Date  . Gall stones   . History of gallstones 01/08/2013  . GERD (gastroesophageal reflux disease) 01/22/2013  . Alternating constipation and diarrhea 01/08/2013     Family History  Problem Relation Age of Onset  . Migraines Mother   . Diverticulitis Mother   . Alcoholism      grandparent  . Lung cancer      grandparent  . Depression Mother   . Diabetes      aunt  . Hyperlipidemia Mother   . Hypertension Mother   . Stroke      greatgrandmother     History  Substance Use Topics  . Smoking status: Former Games developer  . Smokeless tobacco: Not on file  . Alcohol Use: No     Objective: Filed Vitals:   05/23/13 0844  BP: 117/79  Pulse: 78   Vital signs reviewed. General: Alert and Oriented, No Acute Distress HEENT: Pupils equal, round, reactive to light. Conjunctivae clear.  External ears unremarkable.  Moist mucous membranes. Lungs: Clear and comfortable work of breathing, speaking in full sentences without accessory muscle use. Cardiac: Regular rate and rhythm. No murmurs rubs or gallops Neuro: CN II-XII grossly intact, gait normal. Extremities: No peripheral edema.  Strong peripheral pulses.  Mental Status: No depression, anxiety, nor agitation. Logical though process. Skin: Warm and  dry.  Assessment & Plan: Amayrani was seen today for bp and wt check.  Diagnoses and associated orders for this visit:  Obesity - phentermine 37.5 MG capsule; Take 1 capsule (37.5 mg total) by mouth every morning.    Obesity: Improving on phentermine, continue phentermine she will be losing insurance for the next 45 days given her success and tolerance on phentermine will provide her with 60 day prescription she agrees to call in 30 days from now with her weight at home.  Return in about 2 months (around 07/24/2013).

## 2013-07-12 ENCOUNTER — Telehealth: Payer: Self-pay | Admitting: *Deleted

## 2013-07-12 NOTE — Telephone Encounter (Signed)
Don't be too discouraged everyone hits a plateau with phentermine at some point, there are other medications that are available but are much more expensive, we can go over options, risks, and benefits when she follows up.

## 2013-07-12 NOTE — Telephone Encounter (Signed)
Pt.notified

## 2013-07-12 NOTE — Telephone Encounter (Signed)
Pt called to give an update on her wt. She states she called in Aug ( but I dont see any documentation) Pt states she has not lost any wt since July. Pt wanted to know what else she could do or try. I did tell her that sometimes you can hit a plateau with your wt on that medication. Pt wants any ideas or suggestions that you could provide. She will schedule appt sometime this month

## 2013-08-12 ENCOUNTER — Encounter: Payer: Self-pay | Admitting: Family Medicine

## 2013-08-12 ENCOUNTER — Ambulatory Visit (INDEPENDENT_AMBULATORY_CARE_PROVIDER_SITE_OTHER): Payer: BC Managed Care – PPO | Admitting: Family Medicine

## 2013-08-12 VITALS — BP 138/89 | HR 82 | Wt 221.0 lb

## 2013-08-12 DIAGNOSIS — R41840 Attention and concentration deficit: Secondary | ICD-10-CM

## 2013-08-12 DIAGNOSIS — E669 Obesity, unspecified: Secondary | ICD-10-CM

## 2013-08-12 MED ORDER — LORCASERIN HCL 10 MG PO TABS: 10.0000 mg | ORAL_TABLET | Freq: Two times a day (BID) | ORAL | Status: DC

## 2013-08-12 NOTE — Progress Notes (Signed)
CC: Katherine [name] is a 32 y.o. female is here for Medication Problem   Subjective: HPI:  Patient complains of difficulty losing weight despite exercising most days of the week and with counting calories. She noticed during her last month of phentermine use she lost absolutely no weight, still had success with portion control. She denies depression, anxiety, nor mental disturbance. Obesity has been present for over 6 years, improved with phentermine and exercise, worse during times of stress with overeating. Difficulty losing weight has been present on a daily basis for the last 6 weeks. Thyroid has been checked months ago was normal.  Denies fatigue, constipation, memory loss, brittle nails, loss of hair, skin changes  Patient expresses concern that she has difficulty concentrating at home to a moderate severity and mild severity while at work. This is been present for years. She reports her mother always defined her as hyperactive as a child. Patient reports avoiding tasks and responsibilities if they appear to be complex because she knows she will have difficulty completing them. Symptoms have been persistent for the past years nothing particularly makes better or worse she wonders if she has adult ADD.   Review Of Systems Outlined In HPI  Past Medical History  Diagnosis Date  . Gall stones   . History of gallstones 01/08/2013  . GERD (gastroesophageal reflux disease) 01/22/2013  . Alternating constipation and diarrhea 01/08/2013     Family History  Problem Relation Age of Onset  . Migraines Mother   . Diverticulitis Mother   . Alcoholism      grandparent  . Lung cancer      grandparent  . Depression Mother   . Diabetes      aunt  . Hyperlipidemia Mother   . Hypertension Mother   . Stroke      greatgrandmother     History  Substance Use Topics  . Smoking status: Former Games developer  . Smokeless tobacco: Not on file  . Alcohol Use: No     Objective: Filed Vitals:   08/12/13 0943  BP: 138/89  Pulse: 82    Vital signs reviewed. General: Alert and Oriented, No Acute Distress HEENT: Pupils equal, round, reactive to light. Conjunctivae clear.  External ears unremarkable.  Moist mucous membranes. Lungs: Clear and comfortable work of breathing, speaking in full sentences without accessory muscle use. Cardiac: Regular rate and rhythm.  Neuro: CN II-XII grossly intact, gait normal. Extremities: No peripheral edema.  Strong peripheral pulses.  Mental Status: No depression, anxiety, nor agitation. Logical though process. Skin: Warm and dry.  Assessment & Plan: Maysun was seen today for medication problem.  Diagnoses and associated orders for this visit:  Concentration deficit - Ambulatory referral to Psychiatry  Obesity  Other Orders - Lorcaserin HCl (BELVIQ) 10 MG TABS; Take 10 mg by mouth 2 (two) times daily. - Lorcaserin HCl (BELVIQ) 10 MG TABS; Take 10 mg by mouth 2 (two) times daily.    Obesity: Uncontrolled chronic condition, will stop phentermine and discussed options including Topamax versus belviq, she would like to start belviq and was given a 15 day free trial offer and savings card for monthly refills f/u 1-2 months. Concentration difficulties: Referral to Dr. Malena Edman for formal ADD testing.  Return in about 4 weeks (around 09/09/2013).

## 2013-08-20 ENCOUNTER — Ambulatory Visit (INDEPENDENT_AMBULATORY_CARE_PROVIDER_SITE_OTHER): Payer: BC Managed Care – PPO | Admitting: Obstetrics & Gynecology

## 2013-08-20 ENCOUNTER — Encounter: Payer: Self-pay | Admitting: Obstetrics & Gynecology

## 2013-08-20 VITALS — BP 133/83 | HR 74 | Resp 16 | Ht 70.0 in | Wt 223.0 lb

## 2013-08-20 DIAGNOSIS — Z1151 Encounter for screening for human papillomavirus (HPV): Secondary | ICD-10-CM

## 2013-08-20 DIAGNOSIS — Z01419 Encounter for gynecological examination (general) (routine) without abnormal findings: Secondary | ICD-10-CM

## 2013-08-20 DIAGNOSIS — Z124 Encounter for screening for malignant neoplasm of cervix: Secondary | ICD-10-CM

## 2013-08-20 DIAGNOSIS — N63 Unspecified lump in unspecified breast: Secondary | ICD-10-CM

## 2013-08-20 DIAGNOSIS — N6322 Unspecified lump in the left breast, upper inner quadrant: Secondary | ICD-10-CM

## 2013-08-20 DIAGNOSIS — Z8249 Family history of ischemic heart disease and other diseases of the circulatory system: Secondary | ICD-10-CM | POA: Insufficient documentation

## 2013-08-20 MED ORDER — TRIAMCINOLONE 0.1 % CREAM:EUCERIN CREAM 1:1: 1.0000 "application " | TOPICAL_CREAM | Freq: Two times a day (BID) | CUTANEOUS | Status: DC

## 2013-08-20 MED ORDER — NORGESTIM-ETH ESTRAD TRIPHASIC 0.18/0.215/0.25 MG-25 MCG PO TABS: 1.0000 | ORAL_TABLET | Freq: Every day | ORAL | Status: DC

## 2013-08-20 NOTE — Addendum Note (Signed)
Addended by: Granville Lewis on: 08/20/2013 04:41 PM   Modules accepted: Orders

## 2013-08-20 NOTE — Progress Notes (Signed)
  Subjective:     Katherine [name] is a 32 y.o. female here for a routine exam.  Current complaints: breast lump at 10 o'clock on left breast.  Personal health questionnaire reviewed: yes.   Gynecologic History Patient's last menstrual period was 08/14/2013. Contraception: condoms Last Pap: 2012. Results were: normal Last mammogram: n/a.   Obstetric History OB History  Gravida Para Term Preterm AB SAB TAB Ectopic Multiple Living  2 1 1       1     # Outcome Date GA Lbr Len/2nd Weight Sex Delivery Anes PTL Lv  2 GRA           1 TRM              The following portions of the patient's history were reviewed and updated as appropriate: allergies, current medications, past family history, past medical history, past social history, past surgical history and problem list.  Review of Systems Pertinent items are noted in HPI.    Objective:      Filed Vitals:   08/20/13 1500  BP: 133/83  Pulse: 74  Resp: 16  Height: 5\' 10"  (1.778 m)  Weight: 223 lb (101.152 kg)   Vitals:  WNL General appearance: alert, cooperative and no distress Head: Normocephalic, without obvious abnormality, atraumatic Eyes: negative Throat: lips, mucosa, and tongue normal; teeth and gums normal Lungs: clear to auscultation bilaterally Breasts: small 1 cm lump at 10 o'clock left breast (found by patient and is new for her), No nipple retraction or dimpling, No nipple discharge or bleeding Heart: regular rate and rhythm Abdomen: soft, non-tender; bowel sounds normal; no masses,  no organomegaly Pelvic: cervix normal in appearance, external genitalia normal, no adnexal masses or tenderness, no bladder tenderness, no cervical motion tenderness, perianal skin: no external genital warts noted, urethra without abnormality or discharge, uterus normal size, shape, and consistency and vagina normal without discharge Extremities: no edema, redness or tenderness in the calves or thighs Skin: left leg erythema  (eczema) Lymph nodes: Axillary adenopathy: none         Assessment:    Healthy female exam.  Eczema Breast lump   Plan:    Education reviewed: self breast exams. Contraception: condoms. Mammogram ordered. Follow up in: 1 year. triamcenalone cream for excema.  Pt will follow up with primary care MD for further treatment Pt to ask sister about inherited thrombophilia. Pap smear with HPV

## 2013-08-28 ENCOUNTER — Ambulatory Visit
Admission: RE | Admit: 2013-08-28 | Discharge: 2013-08-28 | Disposition: A | Discharge status: Home / Self Care | Payer: BC Managed Care – PPO | Source: Ambulatory Visit | Attending: Obstetrics & Gynecology | Admitting: Obstetrics & Gynecology

## 2013-08-28 ENCOUNTER — Other Ambulatory Visit: Payer: Self-pay | Admitting: Obstetrics & Gynecology

## 2013-08-28 DIAGNOSIS — N6322 Unspecified lump in the left breast, upper inner quadrant: Secondary | ICD-10-CM

## 2013-08-30 ENCOUNTER — Encounter: Payer: Self-pay | Admitting: Obstetrics & Gynecology

## 2013-08-30 DIAGNOSIS — N6320 Unspecified lump in the left breast, unspecified quadrant: Secondary | ICD-10-CM | POA: Insufficient documentation

## 2013-09-05 ENCOUNTER — Ambulatory Visit
Admission: RE | Admit: 2013-09-05 | Discharge: 2013-09-05 | Disposition: A | Discharge status: Home / Self Care | Payer: BC Managed Care – PPO | Source: Ambulatory Visit | Attending: Obstetrics & Gynecology | Admitting: Obstetrics & Gynecology

## 2013-09-05 ENCOUNTER — Other Ambulatory Visit: Payer: Self-pay

## 2013-09-05 DIAGNOSIS — N6322 Unspecified lump in the left breast, upper inner quadrant: Secondary | ICD-10-CM

## 2013-09-12 ENCOUNTER — Telehealth: Payer: Self-pay | Admitting: *Deleted

## 2013-09-12 ENCOUNTER — Encounter: Payer: Self-pay | Admitting: Family Medicine

## 2013-09-12 DIAGNOSIS — E669 Obesity, unspecified: Secondary | ICD-10-CM

## 2013-09-12 DIAGNOSIS — IMO0001 Reserved for inherently not codable concepts without codable children: Secondary | ICD-10-CM

## 2013-09-12 MED ORDER — NORGESTIM-ETH ESTRAD TRIPHASIC 0.18/0.215/0.25 MG-35 MCG PO TABS: 1.0000 | ORAL_TABLET | Freq: Every day | ORAL | Status: DC

## 2013-09-12 MED ORDER — TOPIRAMATE 25 MG PO TABS: 25.0000 mg | ORAL_TABLET | Freq: Two times a day (BID) | ORAL | Status: DC

## 2013-09-12 NOTE — Telephone Encounter (Signed)
Pt req to change OCP from Ortho Tri-Cyclen LO to Ortho Tri-cyclen.

## 2013-09-15 ENCOUNTER — Encounter: Payer: Self-pay | Admitting: Obstetrics & Gynecology

## 2013-09-16 ENCOUNTER — Telehealth: Payer: Self-pay | Admitting: *Deleted

## 2013-09-16 NOTE — Telephone Encounter (Signed)
PA approved through [facility] for Topiramate.  Approved 08/14/13-09/12/16.

## 2013-10-15 ENCOUNTER — Ambulatory Visit (INDEPENDENT_AMBULATORY_CARE_PROVIDER_SITE_OTHER): Payer: BC Managed Care – PPO | Admitting: Family Medicine

## 2013-10-15 ENCOUNTER — Encounter: Payer: Self-pay | Admitting: Family Medicine

## 2013-10-15 VITALS — BP 127/89 | HR 88 | Wt 226.0 lb

## 2013-10-15 DIAGNOSIS — S39012A Strain of muscle, fascia and tendon of lower back, initial encounter: Secondary | ICD-10-CM

## 2013-10-15 DIAGNOSIS — S335XXA Sprain of ligaments of lumbar spine, initial encounter: Secondary | ICD-10-CM

## 2013-10-15 MED ORDER — CYCLOBENZAPRINE HCL 10 MG PO TABS: 10.0000 mg | ORAL_TABLET | Freq: Three times a day (TID) | ORAL | Status: DC | PRN

## 2013-10-15 MED ORDER — MELOXICAM 15 MG PO TABS: 15.0000 mg | ORAL_TABLET | Freq: Every day | ORAL | Status: DC

## 2013-10-15 NOTE — Progress Notes (Signed)
CC: Katherine [name] is a 32 y.o. female is here for Back Pain   Subjective: HPI:  Complains of left low back pain which is nonradiating described as a tightness mild to moderate in severity. Began 3 weeks ago after awkwardly reaching down and out.  Symptoms were gradually improving without any intervention over the first week however returned after painting her living room 5 days ago. Symptoms are persistent however worsened with bending over and reaching outward. Slight improvement with Aleve no other intervention.  She reports she has had pain like this many times in the past it usually comes annually with a similar mechanism causing the pain. She denies midline pain, saddle paresthesia, bowel or bladder incontinence, weakness of the extremities, nor abdominal complaints or genitourinary complaints   Review Of Systems Outlined In HPI  Past Medical History  Diagnosis Date  . Gall stones   . History of gallstones 01/08/2013  . GERD (gastroesophageal reflux disease) 01/22/2013  . Alternating constipation and diarrhea 01/08/2013  . Abnormal Pap smear 2008    Leep     Family History  Problem Relation Age of Onset  . Migraines Mother   . Diverticulitis Mother   . Alcoholism      grandparent  . Lung cancer      grandparent  . Depression Mother   . Diabetes      aunt  . Hyperlipidemia Mother   . Hypertension Mother   . Stroke      greatgrandmother     History  Substance Use Topics  . Smoking status: Former Games developer  . Smokeless tobacco: Not on file  . Alcohol Use: No     Objective: Filed Vitals:   10/15/13 0913  BP: 127/89  Pulse: 88    General: Alert and Oriented, No Acute Distress HEENT: Pupils equal, round, reactive to light. Conjunctivae clear.  Moist mucous membranes pharynx unremarkable Lungs: Clear to auscultation bilaterally, no wheezing/ronchi/rales.  Comfortable work of breathing. Good air movement. Cardiac: Regular rate and rhythm. Normal S1/S2.  No  murmurs, rubs, nor gallops.   Back: No midline spinous process tenderness in the lumbar region, pain is reproduced with palpation of left paraspinal musculature near L4 and L5.  She has full range of motion and strength of the lumbar spine without precipitating pain. Full range of motion strength of both lower extremities, L4 and S1 DTRs two over four bilaterally Extremities: No peripheral edema.  Strong peripheral pulses.  Mental Status: No depression, anxiety, nor agitation. Skin: Warm and dry.  Assessment & Plan: Aujanae was seen today for back pain.  Diagnoses and associated orders for this visit:  Lumbar strain, initial encounter - meloxicam (MOBIC) 15 MG tablet; Take 1 tablet (15 mg total) by mouth daily. - cyclobenzaprine (FLEXERIL) 10 MG tablet; Take 1 tablet (10 mg total) by mouth 3 (three) times daily as needed for muscle spasms.    Lumbar strain: Instructed on relative rest for the next week with as needed use of meloxicam and Flexeril, start home exercise plan on a daily basis for core musculature rehabilitation  25 minutes spent face-to-face during visit today of which at least 50% was counseling or coordinating care regarding lumbar strain.   Return if symptoms worsen or fail to improve.

## 2013-12-15 ENCOUNTER — Other Ambulatory Visit: Payer: Self-pay | Admitting: Family Medicine

## 2014-02-10 ENCOUNTER — Ambulatory Visit (INDEPENDENT_AMBULATORY_CARE_PROVIDER_SITE_OTHER): Payer: BC Managed Care – PPO | Admitting: Family Medicine

## 2014-02-10 ENCOUNTER — Encounter: Payer: Self-pay | Admitting: Family Medicine

## 2014-02-10 VITALS — BP 120/71 | HR 78 | Wt 241.0 lb

## 2014-02-10 DIAGNOSIS — L255 Unspecified contact dermatitis due to plants, except food: Secondary | ICD-10-CM

## 2014-02-10 MED ORDER — HYDROXYZINE HCL 25 MG PO TABS: 25.0000 mg | ORAL_TABLET | Freq: Three times a day (TID) | ORAL | Status: DC | PRN

## 2014-02-10 MED ORDER — METHYLPREDNISOLONE SODIUM SUCC 125 MG IJ SOLR
125.0000 mg | Freq: Once | INTRAMUSCULAR | Status: AC
  Administered 2014-02-10: 125 mg via INTRAMUSCULAR

## 2014-02-10 MED ORDER — PREDNISONE 20 MG PO TABS: ORAL_TABLET | ORAL | Status: AC

## 2014-02-10 NOTE — Progress Notes (Signed)
CC: Katherine [name] is a 33 y.o. female is here for rash on arms   Subjective: HPI:  Reports a rash that was localized on the right proximal arm noticed on Friday which has now spread to involve both forearms, dorsal surface of left foot, back of the neck.  Symptoms slowly been getting worse over the weekend however have plateaued since Sunday. Describes moderate to severe itching but no other discomfort. Itching is localized only to the rash. Is described as red and raised, other than spreading appearance has not been changed. Husband does not have any skin complaints all, she does not recall being exposed to any vegetation that she has had allergies to in the past. No change of personal care products.  Denies fevers, chills, flushing, shortness of breath, wheezing, nor throat swelling.  Review Of Systems Outlined In HPI  Past Medical History  Diagnosis Date  . Gall stones   . History of gallstones 01/08/2013  . GERD (gastroesophageal reflux disease) 01/22/2013  . Alternating constipation and diarrhea 01/08/2013  . Abnormal Pap smear 2008    Leep    No past surgical history on file. Family History  Problem Relation Age of Onset  . Migraines Mother   . Diverticulitis Mother   . Alcoholism      grandparent  . Lung cancer      grandparent  . Depression Mother   . Diabetes      aunt  . Hyperlipidemia Mother   . Hypertension Mother   . Stroke      greatgrandmother    History   Social History  . Marital Status: Married    Spouse Name: N/A    Number of Children: N/A  . Years of Education: N/A   Occupational History  . Not on file.   Social History Main Topics  . Smoking status: Former Games developermoker  . Smokeless tobacco: Not on file  . Alcohol Use: No  . Drug Use: Not on file  . Sexual Activity: Yes   Other Topics Concern  . Not on file   Social History Narrative  . No narrative on file     Objective: BP 120/71  Pulse 78  Wt 241 lb (109.317 kg)  General: Alert  and Oriented, No Acute Distress HEENT: Pupils equal, round, reactive to light. Conjunctivae clear.  Moist membranes there is unremarkable Lungs: Clear to auscultation bilaterally, no wheezing/ronchi/rales.  Comfortable work of breathing. Good air movement. Cardiac: Regular rate and rhythm. Normal S1/S2.  No murmurs, rubs, nor gallops.   Mental Status: No depression, anxiety, nor agitation. Skin: Warm and dry. Clusters of a fine fluid filled vesicular rash with linear streaking which has a mild erythematous base on both forearms, back of the neck, dorsal surface of the left foot.  Assessment & Plan: Katherine [name] was seen today for rash on arms.  Diagnoses and associated orders for this visit:  Rhus dermatitis - predniSONE (DELTASONE) 20 MG tablet; Three tabs daily days 1-3, two tabs daily days 4-6, one tab daily days 7-9, half tab daily days 10-13. - hydrOXYzine (ATARAX/VISTARIL) 25 MG tablet; Take 1 tablet (25 mg total) by mouth 3 (three) times daily as needed.    Appearance is highly suggestive of rhus dermatitis, in hindsight she recalls pending a stray dog prior to symptoms which may have been the culprit. Regardless start prednisone taper and hydroxyzine as needed. She will receive Solu-Medrol 125 mg in office today  Return if symptoms worsen or fail to improve.

## 2014-04-15 ENCOUNTER — Ambulatory Visit (INDEPENDENT_AMBULATORY_CARE_PROVIDER_SITE_OTHER): Payer: BC Managed Care – PPO | Admitting: Family Medicine

## 2014-04-15 ENCOUNTER — Encounter: Payer: Self-pay | Admitting: Family Medicine

## 2014-04-15 VITALS — BP 135/82 | HR 93 | Wt 241.0 lb

## 2014-04-15 DIAGNOSIS — E669 Obesity, unspecified: Secondary | ICD-10-CM

## 2014-04-15 MED ORDER — PHENTERMINE HCL 37.5 MG PO TABS: 37.5000 mg | ORAL_TABLET | Freq: Every day | ORAL | Status: DC

## 2014-04-15 MED ORDER — PHENTERMINE HCL 37.5 MG PO CAPS: 37.5000 mg | ORAL_CAPSULE | ORAL | Status: DC

## 2014-04-15 NOTE — Progress Notes (Signed)
CC: Katherine [name] is a 33 y.o. female is here for discuss getting back on phentermine   Subjective: HPI:  Complains of weight gain with inability to lose weight over the past 6 months. She reports 10 pounds of weight gain despite cutting back on fast food and significantly increasing fruits and veggies in her diet. Currently no formal exercise routine.  Has had success with phentermine in the past, no success with Topamax. Despite favorable cholesterol and blood pressure in her work place wellness exams she is not getting any discount on insurance due to diagnosis of the obesity. Symptoms have improved with phentermine in the past. Nothing particularly makes symptoms worse that she can identify. She reports them as moderate in severity.  Denies polyuria polyphagia polydipsia, swelling of the neck, trouble swallowing.   Review Of Systems Outlined In HPI  Past Medical History  Diagnosis Date  . Gall stones   . History of gallstones 01/08/2013  . GERD (gastroesophageal reflux disease) 01/22/2013  . Alternating constipation and diarrhea 01/08/2013  . Abnormal Pap smear 2008    Leep    No past surgical history on file. Family History  Problem Relation Age of Onset  . Migraines Mother   . Diverticulitis Mother   . Alcoholism      grandparent  . Lung cancer      grandparent  . Depression Mother   . Diabetes      aunt  . Hyperlipidemia Mother   . Hypertension Mother   . Stroke      greatgrandmother    History   Social History  . Marital Status: Married    Spouse Name: N/A    Number of Children: N/A  . Years of Education: N/A   Occupational History  . Not on file.   Social History Main Topics  . Smoking status: Former Games developermoker  . Smokeless tobacco: Not on file  . Alcohol Use: No  . Drug Use: Not on file  . Sexual Activity: Yes   Other Topics Concern  . Not on file   Social History Narrative  . No narrative on file     Objective: BP 135/82  Pulse 93  Wt 241 lb  (109.317 kg)  General: Alert and Oriented, No Acute Distress HEENT: Pupils equal, round, reactive to light. Conjunctivae clear.  External ears unremarkable, canals clear with intact TMs with appropriate landmarks.  Moist mucous membranes pharynx unremarkable Lungs: Clear to auscultation bilaterally, no wheezing/ronchi/rales.  Comfortable work of breathing. Good air movement. Cardiac: Regular rate and rhythm. Normal S1/S2.  No murmurs, rubs, nor gallops.   Abdomen: Obese Extremities: No peripheral edema.  Strong peripheral pulses.  Mental Status: No depression, anxiety, nor agitation. Skin: Warm and dry.  Assessment & Plan: Katherine [name] was seen today for discuss getting back on phentermine.  Diagnoses and associated orders for this visit:  Obesity - Discontinue: phentermine (ADIPEX-P) 37.5 MG tablet; Take 1 tablet (37.5 mg total) by mouth daily before breakfast.  Other Orders - phentermine 37.5 MG capsule; Take 1 capsule (37.5 mg total) by mouth every morning.    Obesity: Uncontrolled, exercise prescription of 30-45 minutes of moderate exercise 5 days a week. Calorie restriction to no grader than 1600 calories a day. Restart phentermine with future prescriptions only if blood pressure remains normal with objective weight loss on a monthly basis  Return in about 4 weeks (around 05/13/2014).

## 2014-05-15 ENCOUNTER — Ambulatory Visit (INDEPENDENT_AMBULATORY_CARE_PROVIDER_SITE_OTHER): Payer: BC Managed Care – PPO | Admitting: Family Medicine

## 2014-05-15 VITALS — BP 120/82 | HR 79 | Temp 98.1°F | Ht 70.0 in | Wt 234.0 lb

## 2014-05-15 DIAGNOSIS — R635 Abnormal weight gain: Secondary | ICD-10-CM

## 2014-05-15 DIAGNOSIS — Z6833 Body mass index (BMI) 33.0-33.9, adult: Secondary | ICD-10-CM

## 2014-05-15 MED ORDER — PHENTERMINE HCL 37.5 MG PO CAPS: 37.5000 mg | ORAL_CAPSULE | ORAL | Status: DC

## 2014-05-15 NOTE — Progress Notes (Signed)
   Subjective:    Patient ID: Katherine [name], female    DOB: 04-22-1981, 33 y.o.   MRN: [id number]  HPI    Review of Systems     Objective:   Physical Exam        Assessment & Plan:  Pt came in today for BP check and a weight check. Pt has lost 7 lbs. Pt denies any abnormal bleeding, ha, cp, palpitations, mood changes./Katherine [name],CMA  Abnormal weight gain/BMI 33-doing very well medication without side effects. We refilled for 30 more days. Followup in one month for blood pressure weight check. Katherine Gasseratherine Metheney, MD

## 2014-06-16 ENCOUNTER — Ambulatory Visit (INDEPENDENT_AMBULATORY_CARE_PROVIDER_SITE_OTHER): Payer: BC Managed Care – PPO | Admitting: Family Medicine

## 2014-06-16 VITALS — BP 128/71 | HR 76 | Ht 70.0 in | Wt 233.0 lb

## 2014-06-16 DIAGNOSIS — R635 Abnormal weight gain: Secondary | ICD-10-CM

## 2014-06-16 MED ORDER — PHENTERMINE HCL 37.5 MG PO CAPS: 37.5000 mg | ORAL_CAPSULE | ORAL | Status: DC

## 2014-06-16 NOTE — Progress Notes (Signed)
Katherine [name], Rx placed in in-box ready for pickup/faxing.  (No CP nor known side effects)

## 2014-06-17 NOTE — Progress Notes (Signed)
rx was faxed yesterday evening

## 2014-07-17 ENCOUNTER — Ambulatory Visit (INDEPENDENT_AMBULATORY_CARE_PROVIDER_SITE_OTHER): Payer: BC Managed Care – PPO | Admitting: Family Medicine

## 2014-07-17 VITALS — BP 132/94 | HR 89 | Ht 70.0 in | Wt 232.0 lb

## 2014-07-17 DIAGNOSIS — R635 Abnormal weight gain: Secondary | ICD-10-CM | POA: Diagnosis not present

## 2014-07-17 MED ORDER — PHENTERMINE HCL 37.5 MG PO CAPS: 37.5000 mg | ORAL_CAPSULE | ORAL | Status: DC

## 2014-07-17 NOTE — Progress Notes (Signed)
   Subjective:    Patient ID: Katherine [name], female    DOB: November 28, 1980, 33 y.o.   MRN: [id number]  HPI Patient presented today for weight check and BP check for phentermine refill. She denies CP, SOB or mood swings. Corliss Skains, CMA    Review of Systems     Objective:   Physical Exam        Assessment & Plan:

## 2014-08-15 ENCOUNTER — Ambulatory Visit: Payer: BC Managed Care – PPO | Admitting: Family Medicine

## 2014-12-03 ENCOUNTER — Ambulatory Visit (INDEPENDENT_AMBULATORY_CARE_PROVIDER_SITE_OTHER): Payer: Self-pay | Admitting: Family Medicine

## 2014-12-03 ENCOUNTER — Encounter: Payer: Self-pay | Admitting: Family Medicine

## 2014-12-03 VITALS — BP 142/78 | HR 78 | Ht 70.0 in | Wt 247.0 lb

## 2014-12-03 DIAGNOSIS — F329 Major depressive disorder, single episode, unspecified: Secondary | ICD-10-CM

## 2014-12-03 DIAGNOSIS — F32A Depression, unspecified: Secondary | ICD-10-CM

## 2014-12-03 MED ORDER — ESCITALOPRAM OXALATE 10 MG PO TABS: 10.0000 mg | ORAL_TABLET | Freq: Every day | ORAL | Status: DC

## 2014-12-03 NOTE — Patient Instructions (Signed)
Half a tablet daily for four days then full tablet thereafter.

## 2014-12-03 NOTE — Progress Notes (Signed)
CC: Judithann SheenMartha [name] Katherine [name] is a 34 y.o. female is here for Insomnia and anger   Subjective: HPI:  Complains of irritability, short fuse, withdrawing from social interactions and her church. Symptoms have been present for the last 6 months and worsening on a monthly basis. She describes anger outbursts at her husband and child over insignificant things like cleanliness or chores. She's embarrassed herself almost on a daily basis for the last week with these outbursts. She reports increased sleeping, increased eating of comfort food, unintentional weight gain. No manic activity or anxiety. 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 She's had similar symptoms in the past however only to a mild degree and she's been able to address them with the mind over matter approach. No thoughts of wanting to harm herself or others   Review Of Systems Outlined In HPI  Past Medical History  Diagnosis Date  . Gall stones   . History of gallstones 01/08/2013  . GERD (gastroesophageal reflux disease) 01/22/2013  . Alternating constipation and diarrhea 01/08/2013  . Abnormal Pap smear 2008    Leep    No past surgical history on file. Family History  Problem Relation Age of Onset  . Migraines Mother   . Diverticulitis Mother   . Alcoholism      grandparent  . Lung cancer      grandparent  . Depression Mother   . Diabetes      aunt  . Hyperlipidemia Mother   . Hypertension Mother   . Stroke      greatgrandmother    History   Social History  . Marital Status: Married    Spouse Name: N/A    Number of Children: N/A  . Years of Education: N/A   Occupational History  . Not on file.   Social History Main Topics  . Smoking status: Former Games developermoker  . Smokeless tobacco: Not on file  . Alcohol Use: No  . Drug Use: Not on file  . Sexual Activity: Yes   Other Topics Concern  . Not on  file   Social History Narrative     Objective: BP 142/78 mmHg  Pulse 78  Ht 5\' 10"  (1.778 m)  Wt 247 lb (112.038 kg)  BMI 35.44 kg/m2  Vital signs reviewed. General: Alert and Oriented, No Acute Distress HEENT: Pupils equal, round, reactive to light. Conjunctivae clear.  External ears unremarkable.  Moist mucous membranes. Lungs: Clear and comfortable work of breathing, speaking in full sentences without accessory muscle use. Cardiac: Regular rate and rhythm.  Neuro: CN II-XII grossly intact, gait normal. Extremities: No peripheral edema.  Strong peripheral pulses.  Mental Status: Mild depression or anxiety or agitation. Logical though process. Skin: Warm and dry.  Assessment & Plan: Katherine BridgeMartha was seen today for insomnia and anger.  Diagnoses and associated orders for this visit:  Depression - escitalopram (LEXAPRO) 10 MG tablet; Take 1 tablet (10 mg total) by mouth daily.    Depression: Starting Lexapro, contract for safety. She filled out a pH Q9 that we can compare to a future questionnaire in the future if there is any question about whether or not she is improving. Follow-up one month  Return if symptoms worsen or fail to improve.

## 2015-01-01 ENCOUNTER — Encounter: Payer: Self-pay | Admitting: Family Medicine

## 2015-01-01 ENCOUNTER — Ambulatory Visit (INDEPENDENT_AMBULATORY_CARE_PROVIDER_SITE_OTHER): Payer: BLUE CROSS/BLUE SHIELD | Admitting: Family Medicine

## 2015-01-01 VITALS — BP 137/79 | HR 97 | Ht 70.0 in | Wt 252.0 lb

## 2015-01-01 DIAGNOSIS — B349 Viral infection, unspecified: Secondary | ICD-10-CM

## 2015-01-01 DIAGNOSIS — B9789 Other viral agents as the cause of diseases classified elsewhere: Secondary | ICD-10-CM

## 2015-01-01 DIAGNOSIS — F329 Major depressive disorder, single episode, unspecified: Secondary | ICD-10-CM

## 2015-01-01 DIAGNOSIS — F32A Depression, unspecified: Secondary | ICD-10-CM | POA: Insufficient documentation

## 2015-01-01 DIAGNOSIS — J329 Chronic sinusitis, unspecified: Secondary | ICD-10-CM

## 2015-01-01 MED ORDER — ESCITALOPRAM OXALATE 10 MG PO TABS: 10.0000 mg | ORAL_TABLET | Freq: Every day | ORAL | Status: DC

## 2015-01-01 NOTE — Progress Notes (Signed)
CC: Katherine [name] is a 34 y.o. female is here for Follow-up   Subjective: HPI:  Follow-up depression: Since I saw her last she's been taking Lexapro with only one side effect described below. She tells me that within a week she noticed that she was much more calm and mellow around stressful situations. She is no longer having a short fuse, irritability, or lack of patients with others. She still feels that she's not as outgoing and she used to be but she thinks this is somewhat improving. Denies any thoughts of wanting to harm herself or others nor anxiousness. No other mental disturbance. The only side effect is that she notices it takes longer to reach climax when having sex. She also has almost no libido now. She still finds pleasure with sex though once  The act is initiated. No genitourinary complaints other than this.  Complains of nasal congestion and facial pressure localized above the eyes. Thick nasal discharge from the nose andpostnasal drip. Nonproductive cough. Accompanied by subjective fevers chills and muscle aches over the weekend however all the above symptoms which were once moderate in severity are now mild in severity. She is worried that it's lasting longer than it should be it's been 6 days since onset. No  Chest pain wheezing shortness of breath nor joint pain   Review Of Systems Outlined In HPI  Past Medical History  Diagnosis Date  . Gall stones   . History of gallstones 01/08/2013  . GERD (gastroesophageal reflux disease) 01/22/2013  . Alternating constipation and diarrhea 01/08/2013  . Abnormal Pap smear 2008    Leep    No past surgical history on file. Family History  Problem Relation Age of Onset  . Migraines Mother   . Diverticulitis Mother   . Alcoholism      grandparent  . Lung cancer      grandparent  . Depression Mother   . Diabetes      aunt  . Hyperlipidemia Mother   . Hypertension Mother   . Stroke      greatgrandmother    History    Social History  . Marital Status: Married    Spouse Name: N/A  . Number of Children: N/A  . Years of Education: N/A   Occupational History  . Not on file.   Social History Main Topics  . Smoking status: Former Games developermoker  . Smokeless tobacco: Not on file  . Alcohol Use: No  . Drug Use: Not on file  . Sexual Activity: Yes   Other Topics Concern  . Not on file   Social History Narrative     Objective: BP 137/79 mmHg  Pulse 97  Ht 5\' 10"  (1.778 m)  Wt 252 lb (114.306 kg)  BMI 36.16 kg/m2  General: Alert and Oriented, No Acute Distress HEENT: Pupils equal, round, reactive to light. Conjunctivae clear.  External ears unremarkable, canals clear with intact TMs with appropriate landmarks.  Middle ear appears open without effusion. Pink inferior turbinates.  Moist mucous membranes, pharynx without inflammation nor lesions.  Neck supple without palpable lymphadenopathy nor abnormal masses. Lungs: Clear to auscultation bilaterally, no wheezing/ronchi/rales.  Comfortable work of breathing. Good air movement. Cardiac: Regular rate and rhythm. Normal S1/S2.  No murmurs, rubs, nor gallops.   Extremities: No peripheral edema.  Strong peripheral pulses.  Mental Status: No depression, anxiety, nor agitation. Skin: Warm and dry.  Assessment & Plan: Katherine BridgeMartha was seen today for follow-up.  Diagnoses and all orders for this visit:  Depression Orders: -     escitalopram (LEXAPRO) 10 MG tablet; Take 1 tablet (10 mg total) by mouth daily.  Viral sinusitis   Depression: Controlled and improved on Lexapro continue 10 mg regimen. I offered cutting back to 5 mg to see if this still controls her symptoms while reducing sexual side effects. She tells me that the side effects do not bother her one bit more of a an annoyance for her husband. She'll continue on 10 mg for the next 3 months at least. Viral sinusitis: Reassurance provided that this should resolve on its own within the next 3 or 4 days.  Consider nasal saline washes to help with postnasal drip. No signs of bacterial infection  25 minutes spent face-to-face during visit today of which at least 50% was counseling or coordinating care regarding: 1. Depression   2. Viral sinusitis       Return in about 3 months (around 04/03/2015) for Mood.

## 2015-08-03 ENCOUNTER — Ambulatory Visit (INDEPENDENT_AMBULATORY_CARE_PROVIDER_SITE_OTHER): Payer: BLUE CROSS/BLUE SHIELD | Admitting: Obstetrics & Gynecology

## 2015-08-03 ENCOUNTER — Encounter: Payer: Self-pay | Admitting: Obstetrics & Gynecology

## 2015-08-03 VITALS — BP 128/77 | HR 81 | Resp 16 | Ht 70.0 in | Wt 255.0 lb

## 2015-08-03 DIAGNOSIS — Z1151 Encounter for screening for human papillomavirus (HPV): Secondary | ICD-10-CM | POA: Diagnosis not present

## 2015-08-03 DIAGNOSIS — Z01419 Encounter for gynecological examination (general) (routine) without abnormal findings: Secondary | ICD-10-CM

## 2015-08-03 DIAGNOSIS — Z124 Encounter for screening for malignant neoplasm of cervix: Secondary | ICD-10-CM

## 2015-08-03 DIAGNOSIS — Z0000 Encounter for general adult medical examination without abnormal findings: Secondary | ICD-10-CM

## 2015-08-03 NOTE — Progress Notes (Signed)
Subjective:    Katherine [name] is a 34 y.o. MW P1 (49 yo daughter)  female who presents for an annual exam. The patient has no complaints today. The patient is sexually active. GYN screening history: last pap: was normal. The patient wears seatbelts: yes. The patient participates in regular exercise: no. Has the patient ever been transfused or tattooed?: yes. The patient reports that there is not domestic violence in her life.   Menstrual History: OB History    Gravida Para Term Preterm AB TAB SAB Ectopic Multiple Living   Menarche age: 11  Patient's last menstrual period was 07/21/2015.    The following portions of the patient's history were reviewed and updated as appropriate: allergies, current medications, past family history, past medical history, past social history, past surgical history and problem list.  Review of Systems Pertinent items noted in HPI and remainder of comprehensive ROS otherwise negative. Periods last 4 days per month. 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 Not using contraception for 5 years except for withdrawal. She declines a flu vaccine.   Objective:    BP 128/77 mmHg  Pulse 81  Resp 16  Ht  (1.778 m)  Wt 255 lb (115.667 kg)  BMI 36.59 kg/m2  LMP 07/21/2015  General Appearance:    Alert, cooperative, no distress, appears stated age  Head:    Normocephalic, without obvious abnormality, atraumatic  Eyes:    PERRL, conjunctiva/corneas clear, EOM's intact, fundi    benign, both eyes  Ears:    Normal TM's and external ear canals, both ears  Nose:   Nares normal, septum midline, mucosa normal, no drainage    or sinus tenderness  Throat:   Lips, mucosa, and tongue normal; teeth and gums normal  Neck:   Supple, symmetrical, trachea midline, no adenopathy;    thyroid:  no enlargement/tenderness/nodules; no carotid   bruit or JVD  Back:     Symmetric, no curvature, ROM normal, no CVA tenderness  Lungs:     Clear to auscultation  bilaterally, respirations unlabored  Chest Wall:    No tenderness or deformity   Heart:    Regular rate and rhythm, S1 and S2 normal, no murmur, rub   or gallop  Breast Exam:    No tenderness, masses, or nipple abnormality  Abdomen:     Soft, non-tender, bowel sounds active all four quadrants,    no masses, no organomegaly  Genitalia:    Normal female without lesion, discharge or tenderness, NSSA, NT, mobile, normal adnexal exam     Extremities:   Extremities normal, atraumatic, no cyanosis or edema  Pulses:   2+ and symmetric all extremities  Skin:   Skin color, texture, turgor normal, no rashes or lesions  Lymph nodes:   Cervical, supraclavicular, and axillary nodes normal  Neurologic:   CNII-XII intact, normal strength, sensation and reflexes    throughout  .    Assessment:    Healthy female exam.    Plan:     Breast self exam technique reviewed and patient encouraged to perform self-exam monthly. Thin prep Pap smear. with cotesting Rec MVIs daily to help prevent ONTDs

## 2015-08-06 LAB — CYTOLOGY - PAP

## 2015-09-14 ENCOUNTER — Encounter: Payer: Self-pay | Admitting: Advanced Practice Midwife

## 2015-09-14 ENCOUNTER — Ambulatory Visit (INDEPENDENT_AMBULATORY_CARE_PROVIDER_SITE_OTHER): Payer: BLUE CROSS/BLUE SHIELD | Admitting: Advanced Practice Midwife

## 2015-09-14 VITALS — BP 117/72 | HR 68 | Wt 257.0 lb

## 2015-09-14 DIAGNOSIS — O09521 Supervision of elderly multigravida, first trimester: Secondary | ICD-10-CM

## 2015-09-14 DIAGNOSIS — O30001 Twin pregnancy, unspecified number of placenta and unspecified number of amniotic sacs, first trimester: Secondary | ICD-10-CM | POA: Diagnosis not present

## 2015-09-14 DIAGNOSIS — Z36 Encounter for antenatal screening of mother: Secondary | ICD-10-CM

## 2015-09-14 DIAGNOSIS — O09891 Supervision of other high risk pregnancies, first trimester: Secondary | ICD-10-CM | POA: Insufficient documentation

## 2015-09-14 DIAGNOSIS — Z113 Encounter for screening for infections with a predominantly sexual mode of transmission: Secondary | ICD-10-CM | POA: Diagnosis not present

## 2015-09-14 DIAGNOSIS — O3441 Maternal care for other abnormalities of cervix, first trimester: Secondary | ICD-10-CM

## 2015-09-14 DIAGNOSIS — Z9889 Other specified postprocedural states: Secondary | ICD-10-CM

## 2015-09-14 LAB — OB RESULTS CONSOLE GC/CHLAMYDIA: Gonorrhea: NEGATIVE

## 2015-09-14 MED ORDER — CONCEPT OB 130-92.4-1 MG PO CAPS: 1.0000 | ORAL_CAPSULE | Freq: Every day | ORAL | Status: DC

## 2015-09-14 NOTE — Patient Instructions (Signed)
Pregnancy and Influenza °Influenza, also called the flu, is an infection of the respiratory tract. If you are pregnant, you are more likely to catch the flu. You are also more likely to have a more serious case of the flu. This is because pregnancy lowers your body's ability to fight off infections (it weakens your immune system). It also puts additional stress on your heart and lungs, which makes you more likely to have complications. Having a bad case of the flu, especially with a high fever, can be dangerous for your developing baby. It can cause you to go into early labor. °HOW DO PEOPLE GET THE FLU? °The flu is caused by the influenza virus. This virus is common every year in the fall and winter. It spreads when virus particles get passed from person to person. You can get the virus if you are near a sick person who is coughing or sneezing. You can also get the virus if you touch something that has the virus on it and then touch your face. °HOW CAN I PROTECT MYSELF AGAINST THE FLU? °· Get a flu shot. The best way to prevent the flu is to get a flu shot before flu season starts. The flu shot is not dangerous for your developing baby. It may even help protect your baby from the flu for up to 6 months after birth. The flu shot is one type of flu vaccine. Another type is a nasal spray vaccine. Do not get the nasal spray vaccine. It is not approved for pregnancy. °· Do not come in close contact with sick people. °· Do not share food, drinks, or utensils with other people. °· Wash your hands often. Use hand sanitizer when soap and water are not available. °WHAT SHOULD I DO IF I HAVE FLU SYMPTOMS? °If you have any flu symptoms, call your health care provider right away. Flu symptoms include: °· Fever or chills. °· Muscle aches. °· Headache. °· Sore throat. °· Nasal congestion. °· Cough. °· Feeling tired. °· Loss of appetite. °· Vomiting. °· Diarrhea. °You may be able to take an antiviral medicine to keep the flu from  becoming severe and to shorten how long it lasts. °WHAT SHOULD I DO AT HOME IF I AM DIAGNOSED WITH THE FLU? °· Do not take any medicine, including cold or flu medicine, unless directed by your health care provider. °· If you take antiviral medicine, make sure you finish it even if you start to feel better. °· Drink enough fluid to keep your urine clear or pale yellow. °· Get plenty of rest. °WHEN WOULD I SEEK IMMEDIATE MEDICAL CARE IF I HAVE THE FLU? °· You have trouble breathing. °· You have chest pain. °· You begin to have labor pains. °· You have a high fever that does not go down after you take medicine. °· You do not feel your baby move. °· You have diarrhea or vomiting that will not go away. °  °This information is not intended to replace advice given to you by your health care provider. Make sure you discuss any questions you have with your health care provider. °  °Document Released: 08/19/2008 Document Revised: 10/22/2013 Document Reviewed: 09/13/2013 °Elsevier Interactive Patient Education ©2016 [employer] Inc[employer] ° °

## 2015-09-14 NOTE — Progress Notes (Signed)
Bedside US shows twin pregnancy. Baby A = 173FHR CRL 279w0d , Baby B = 167FHR CRL 5102w6d

## 2015-09-14 NOTE — Progress Notes (Signed)
   Subjective:    Katherine SheenMartha L [name] is a [id number] 3180w6d being seen today for her first obstetrical visit.  Her obstetrical history is significant for advanced maternal age. Patient does intend to breast feed/pump. Pregnancy history fully reviewed.  Patient reports no complaints.  Filed Vitals:   09/14/15 0930  BP: 117/72  Pulse: 68  Weight: 257 lb (116.574 kg)    HISTORY: OB History  Gravida Para Term Preterm AB SAB TAB Ectopic Multiple Living  3 1 1  1  1   1     # Outcome Date GA Lbr Len/2nd Weight Sex Delivery Anes PTL Lv  3 Current           2 Term 11/18/00 7835w3d   F Vag-Spont   Y  1 TAB              Past Medical History  Diagnosis Date  . Gall stones   . History of gallstones 01/08/2013  . GERD (gastroesophageal reflux disease) 01/22/2013  . Alternating constipation and diarrhea 01/08/2013  . Abnormal Pap smear 2008    Leep  . History of depression    Past Surgical History  Procedure Laterality Date  . Wisdom tooth extraction     Family History  Problem Relation Age of Onset  . Migraines Mother   . Diverticulitis Mother   . Depression Mother   . Hyperlipidemia Mother   . Hypertension Mother   . Alcoholism      grandparent  . Lung cancer      grandparent  . Diabetes      aunt  . Stroke      greatgrandmother  . Diabetes Maternal Grandmother      Exam   BP 117/72 mmHg  Pulse 68  Wt 257 lb (116.574 kg)  LMP 07/21/2015  Live twin IUP seen on informal transvaginal ultrasound  Uterus:    nonpalpable abdominally   Pelvic Exam: Deferred due to recent exam    Bony Pelvis: proven  System: Breast:  Deferred due to recent exam    Skin: normal coloration and turgor, no rashes    Neurologic: oriented, normal mood, grossly non-focal   Extremities: normal strength, tone, and muscle mass   HEENT sclera clear, anicteric and thyroid without masses   Mouth/Teeth mucous membranes moist, pharynx normal without lesions and dental hygiene good   Neck supple and  no masses   Cardiovascular: regular rate and rhythm, no murmurs or gallops   Respiratory:  appears well, vitals normal, no respiratory distress, acyanotic, normal RR, chest clear, no wheezing, crepitations, rhonchi, normal symmetric air entry   Abdomen: soft, non-tender; bowel sounds normal; no masses,  no organomegaly   Urinary: Deferred      Assessment:    Pregnancy: [id number] Patient Active Problem List   Diagnosis Date Noted  . Supervision of other high risk pregnancies, first trimester 09/14/2015  . Depression 01/01/2015  . Mass of breast, left 08/30/2013  . Family history of DVT 08/20/2013  . Concentration deficit 08/12/2013  . GERD (gastroesophageal reflux disease) 01/22/2013  . Obesity 01/22/2013  . Elevated BP 01/22/2013  . History of gallstones 01/08/2013  . Alternating constipation and diarrhea 01/08/2013  . Abdominal pain, chronic, epigastric 01/08/2013        Plan:     Initial labs drawn. Prenatal vitamins. Problem list reviewed and updated. Genetic Screening discussed First Screen: requested.  Ultrasound discussed; fetal survey: requested.  Follow up in 4 weeks.  Dorathy KinsmanSMITH, Corabelle Spackman 09/14/2015

## 2015-09-15 LAB — PRENATAL PROFILE (SOLSTAS)
Antibody Screen: NEGATIVE
BASOS ABS: 0 10*3/uL (ref 0.0–0.1)
Basophils Relative: 0 % (ref 0–1)
EOS PCT: 2 % (ref 0–5)
Eosinophils Absolute: 0.2 10*3/uL (ref 0.0–0.7)
HEMATOCRIT: 36.4 % (ref 36.0–46.0)
HEP B S AG: NEGATIVE
HIV: NONREACTIVE
Hemoglobin: 12.5 g/dL (ref 12.0–15.0)
LYMPHS ABS: 2.9 10*3/uL (ref 0.7–4.0)
LYMPHS PCT: 31 % (ref 12–46)
MCH: 31.3 pg (ref 26.0–34.0)
MCHC: 34.3 g/dL (ref 30.0–36.0)
MCV: 91.2 fL (ref 78.0–100.0)
MPV: 9.2 fL (ref 8.6–12.4)
Monocytes Absolute: 0.5 10*3/uL (ref 0.1–1.0)
Monocytes Relative: 5 % (ref 3–12)
Neutro Abs: 5.7 10*3/uL (ref 1.7–7.7)
Neutrophils Relative %: 62 % (ref 43–77)
PLATELETS: 364 10*3/uL (ref 150–400)
RBC: 3.99 MIL/uL (ref 3.87–5.11)
RDW: 14.2 % (ref 11.5–15.5)
RH TYPE: NEGATIVE
Rubella: 2.43 Index — ABNORMAL HIGH (ref <0.90)
WBC: 9.2 10*3/uL (ref 4.0–10.5)

## 2015-09-16 LAB — GC/CHLAMYDIA PROBE AMP (~~LOC~~) NOT AT ARMC
CHLAMYDIA, DNA PROBE: NEGATIVE
Neisseria Gonorrhea: NEGATIVE

## 2015-09-17 LAB — CULTURE, OB URINE

## 2015-09-21 ENCOUNTER — Telehealth: Payer: Self-pay | Admitting: *Deleted

## 2015-09-21 DIAGNOSIS — O219 Vomiting of pregnancy, unspecified: Secondary | ICD-10-CM

## 2015-09-21 DIAGNOSIS — N390 Urinary tract infection, site not specified: Secondary | ICD-10-CM

## 2015-09-21 MED ORDER — DOXYLAMINE-PYRIDOXINE 10-10 MG PO TBEC: DELAYED_RELEASE_TABLET | ORAL | Status: DC

## 2015-09-21 MED ORDER — NITROFURANTOIN MONOHYD MACRO 100 MG PO CAPS: 100.0000 mg | ORAL_CAPSULE | Freq: Two times a day (BID) | ORAL | Status: DC

## 2015-09-21 NOTE — Telephone Encounter (Signed)
RX sent to pharmacy for Diclegis for nausea and Macrobid for UTI per VO Ivonne AndrewV Smith, CNM

## 2015-09-25 DIAGNOSIS — O3441 Maternal care for other abnormalities of cervix, first trimester: Secondary | ICD-10-CM | POA: Insufficient documentation

## 2015-09-25 DIAGNOSIS — Z9889 Other specified postprocedural states: Secondary | ICD-10-CM | POA: Insufficient documentation

## 2015-09-25 DIAGNOSIS — O09529 Supervision of elderly multigravida, unspecified trimester: Secondary | ICD-10-CM | POA: Insufficient documentation

## 2015-10-12 ENCOUNTER — Ambulatory Visit (INDEPENDENT_AMBULATORY_CARE_PROVIDER_SITE_OTHER): Payer: BLUE CROSS/BLUE SHIELD | Admitting: Advanced Practice Midwife

## 2015-10-12 VITALS — BP 111/71 | HR 76 | Wt 257.0 lb

## 2015-10-12 DIAGNOSIS — O30001 Twin pregnancy, unspecified number of placenta and unspecified number of amniotic sacs, first trimester: Secondary | ICD-10-CM

## 2015-10-12 DIAGNOSIS — O3441 Maternal care for other abnormalities of cervix, first trimester: Secondary | ICD-10-CM

## 2015-10-12 DIAGNOSIS — O99891 Other specified diseases and conditions complicating pregnancy: Secondary | ICD-10-CM | POA: Insufficient documentation

## 2015-10-12 DIAGNOSIS — E669 Obesity, unspecified: Secondary | ICD-10-CM

## 2015-10-12 DIAGNOSIS — Z9889 Other specified postprocedural states: Secondary | ICD-10-CM

## 2015-10-12 DIAGNOSIS — O09891 Supervision of other high risk pregnancies, first trimester: Secondary | ICD-10-CM

## 2015-10-12 DIAGNOSIS — Z6791 Unspecified blood type, Rh negative: Secondary | ICD-10-CM | POA: Insufficient documentation

## 2015-10-12 DIAGNOSIS — O360112 Maternal care for anti-D [Rh] antibodies, first trimester, fetus 2: Secondary | ICD-10-CM

## 2015-10-12 DIAGNOSIS — O26891 Other specified pregnancy related conditions, first trimester: Secondary | ICD-10-CM

## 2015-10-12 DIAGNOSIS — O09521 Supervision of elderly multigravida, first trimester: Secondary | ICD-10-CM

## 2015-10-12 DIAGNOSIS — O30041 Twin pregnancy, dichorionic/diamniotic, first trimester: Secondary | ICD-10-CM | POA: Insufficient documentation

## 2015-10-12 DIAGNOSIS — R8271 Bacteriuria: Secondary | ICD-10-CM

## 2015-10-12 DIAGNOSIS — O219 Vomiting of pregnancy, unspecified: Secondary | ICD-10-CM

## 2015-10-12 DIAGNOSIS — O9989 Other specified diseases and conditions complicating pregnancy, childbirth and the puerperium: Secondary | ICD-10-CM

## 2015-10-12 MED ORDER — ONDANSETRON HCL 4 MG PO TABS: 4.0000 mg | ORAL_TABLET | Freq: Three times a day (TID) | ORAL | Status: DC | PRN

## 2015-10-12 NOTE — Patient Instructions (Signed)
Eating Plan for Hyperemesis Gravidarum °Severe cases of hyperemesis gravidarum can lead to dehydration and malnutrition. The hyperemesis eating plan is one way to lessen the symptoms of nausea and vomiting. It is often used with prescribed medicines to control your symptoms.  °WHAT CAN I DO TO RELIEVE MY SYMPTOMS? °Listen to your body. Everyone is different and has different preferences. Find what works best for you. Some of the following things may help: °· Eat and drink slowly. °· Eat 5-6 small meals daily instead of 3 large meals.   °· Eat crackers before you get out of bed in the morning.   °· Starchy foods are usually well tolerated (such as cereal, toast, bread, potatoes, pasta, rice, and pretzels).   °· Ginger may help with nausea. Add ¼ tsp ground ginger to hot tea or choose ginger tea.   °· Try drinking 100% fruit juice or an electrolyte drink. °· Continue to take your prenatal vitamins as directed by your health care provider. If you are having trouble taking your prenatal vitamins, talk with your health care provider about different options. °· Include at least 1 serving of protein with your meals and snacks (such as meats or poultry, beans, nuts, eggs, or yogurt). Try eating a protein-rich snack before bed (such as cheese and crackers or a half turkey or peanut butter sandwich). °WHAT THINGS SHOULD I AVOID TO REDUCE MY SYMPTOMS? °The following things may help reduce your symptoms: °· Avoid foods with strong smells. Try eating meals in well-ventilated areas that are free of odors. °· Avoid drinking water or other beverages with meals. Try not to drink anything less than 30 minutes before and after meals. °· Avoid drinking more than 1 cup of fluid at a time. °· Avoid fried or high-fat foods, such as butter and cream sauces. °· Avoid spicy foods. °· Avoid skipping meals the best you can. Nausea can be more intense on an empty stomach. If you cannot tolerate food at that time, do not force it. Try sucking on  ice chips or other frozen items and make up the calories later. °· Avoid lying down within 2 hours after eating. °  °This information is not intended to replace advice given to you by your health care provider. Make sure you discuss any questions you have with your health care provider. °  °Document Released: 08/14/2007 Document Revised: 10/22/2013 Document Reviewed: 08/21/2013 °Elsevier Interactive Patient Education ©2016 [employer] ° °

## 2015-10-12 NOTE — Progress Notes (Signed)
Pt would like Zofran as Diclegis is too expensive.

## 2015-10-12 NOTE — Progress Notes (Signed)
Subjective:  Katherine [name] is a 34 y.o. G3P1011 at 6567w6d being seen today for ongoing prenatal care.  She is currently monitored for the following issues for this high-risk pregnancy and has History of gallstones; Alternating constipation and diarrhea; Abdominal pain, chronic, epigastric; GERD (gastroesophageal reflux disease); Obesity; Elevated BP; Concentration deficit; Family history of DVT; Mass of breast, left; Depression; Supervision of other high risk pregnancies, first trimester; History of LEEP (loop electrosurgical excision procedure) of cervix complicating pregnancy in first trimester; AMA (advanced maternal age) multigravida 35+; Twin gestation in first trimester; and Rh negative status during pregnancy in first trimester, antepartum on her problem list.  Patient reports nausea and vomiting adequately controlled w/ max dose of Diclegis, but Rx is very expensive. Wants to switch to Zofran after first trimester.  Contractions: Not present. Vag. Bleeding: None.  Movement: Absent. Denies leaking of fluid.   The following portions of the patient's history were reviewed and updated as appropriate: allergies, current medications, past family history, past medical history, past social history, past surgical history and problem list. Problem list updated.  Objective:   Filed Vitals:   10/12/15 0852  BP: 111/71  Pulse: 76  Weight: 257 lb (116.574 kg)    Fetal Status: Fetal Heart Rate (bpm): 166/158   Movement: Absent     General:  Alert, oriented and cooperative. Patient is in no acute distress.  Skin: Skin is warm and dry. No rash noted.   Cardiovascular: Normal heart rate noted  Respiratory: Normal respiratory effort, no problems with respiration noted  Abdomen: Soft, gravid, appropriate for gestational age. Pain/Pressure: Absent     Pelvic: Vag. Bleeding: None Vag D/C Character: Thin   Cervical exam deferred        Extremities: Normal range of motion.  Edema: None  Mental  Status: Normal mood and affect. Normal behavior. Normal judgment and thought content.   Urinalysis: Urine Protein: Negative Urine Glucose: Negative  Assessment and Plan:  Pregnancy: G3P1011 at 3967w6d  1. Twin gestation in first trimester  - US MFM Fetal Nuchal Translucency; Future--Also check chorionicity - Genetic Screening - US MFM FETAL NUCHAL TRANS ADDL GEST; Future  2. Obesity  - US MFM Fetal Nuchal Translucency; Future - US MFM FETAL NUCHAL TRANS ADDL GEST; Future  3. Supervision of other high risk pregnancies, first trimester  - US MFM Fetal Nuchal Translucency; Future - Genetic Screening - US MFM FETAL NUCHAL TRANS ADDL GEST; Future  4. AMA (advanced maternal age) multigravida 35+, first trimester  - US MFM Fetal Nuchal Translucency; Future - Genetic Screening - US MFM FETAL NUCHAL TRANS ADDL GEST; Future  5. History of LEEP (loop electrosurgical excision procedure) of cervix complicating pregnancy in first trimester  - US MFM Fetal Nuchal Translucency; Future - US MFM FETAL NUCHAL TRANS ADDL GEST; Future  6. Rh negative status during pregnancy in first trimester, antepartum, fetus 2  -No Rhophylac needed due to FOB Rh neg  7. Nausea and vomiting during pregnancy prior to [redacted] weeks gestation  - ondansetron (ZOFRAN) 4 MG tablet; Take 1 tablet (4 mg total) by mouth every 8 (eight) hours as needed for nausea or vomiting.  Dispense: 30 tablet; Refill: 2  Preterm labor symptoms and general obstetric precautions including but not limited to vaginal bleeding, contractions, leaking of fluid and fetal movement were reviewed in detail with the patient. Please refer to After Visit Summary for other counseling recommendations.  Harmony drawn Return in about 4 weeks (around 11/09/2015).   Dorathy KinsmanVirginia Whitnie Deleon,  CNM

## 2015-10-16 ENCOUNTER — Encounter (HOSPITAL_COMMUNITY): Payer: Self-pay

## 2015-10-16 ENCOUNTER — Ambulatory Visit (HOSPITAL_COMMUNITY)
Admission: RE | Admit: 2015-10-16 | Discharge: 2015-10-16 | Disposition: A | Discharge status: Home / Self Care | Payer: BLUE CROSS/BLUE SHIELD | Source: Ambulatory Visit | Attending: Advanced Practice Midwife | Admitting: Advanced Practice Midwife

## 2015-10-16 ENCOUNTER — Other Ambulatory Visit: Payer: Self-pay | Admitting: Advanced Practice Midwife

## 2015-10-16 VITALS — BP 125/65 | HR 73 | Wt 259.0 lb

## 2015-10-16 DIAGNOSIS — Z9889 Other specified postprocedural states: Secondary | ICD-10-CM

## 2015-10-16 DIAGNOSIS — O3440 Maternal care for other abnormalities of cervix, unspecified trimester: Secondary | ICD-10-CM | POA: Diagnosis not present

## 2015-10-16 DIAGNOSIS — O30041 Twin pregnancy, dichorionic/diamniotic, first trimester: Secondary | ICD-10-CM | POA: Diagnosis not present

## 2015-10-16 DIAGNOSIS — Z36 Encounter for antenatal screening of mother: Secondary | ICD-10-CM | POA: Insufficient documentation

## 2015-10-16 DIAGNOSIS — Z3A12 12 weeks gestation of pregnancy: Secondary | ICD-10-CM | POA: Diagnosis not present

## 2015-10-16 DIAGNOSIS — O3441 Maternal care for other abnormalities of cervix, first trimester: Secondary | ICD-10-CM

## 2015-10-16 DIAGNOSIS — O09891 Supervision of other high risk pregnancies, first trimester: Secondary | ICD-10-CM

## 2015-10-16 DIAGNOSIS — O09521 Supervision of elderly multigravida, first trimester: Secondary | ICD-10-CM | POA: Insufficient documentation

## 2015-10-16 DIAGNOSIS — O99211 Obesity complicating pregnancy, first trimester: Secondary | ICD-10-CM | POA: Diagnosis not present

## 2015-10-16 DIAGNOSIS — O30001 Twin pregnancy, unspecified number of placenta and unspecified number of amniotic sacs, first trimester: Secondary | ICD-10-CM

## 2015-10-16 DIAGNOSIS — E669 Obesity, unspecified: Secondary | ICD-10-CM

## 2015-10-16 DIAGNOSIS — O30049 Twin pregnancy, dichorionic/diamniotic, unspecified trimester: Secondary | ICD-10-CM

## 2015-10-20 ENCOUNTER — Telehealth: Payer: Self-pay | Admitting: *Deleted

## 2015-10-20 NOTE — Telephone Encounter (Signed)
Pt notified of Harmony results and copy emailed to patient per her request.

## 2015-11-01 NOTE — L&D Delivery Note (Signed)
Patient is 35 y.o. [id number] 6385w1d admitted for preeclampsia with severe features and had an IOL for preeclampsia. Pregnancy complicated by di-di concordant growth twins that were vertex and breech.     Baltazar NajjarBodenheimer, BoyA Layana [[id number]]  Delivery Note At 11:33 PM a viable female was delivered via Vaginal, Spontaneous Delivery (Presentation:OA).  APGAR: 8,9; weight 5 lb 6.1 oz (2440 g).   Placenta status: Intact, Spontaneous.  Cord: 3 vessels with the following complications: .  Cord pH: not collected  Anesthesia: Epidural  Episiotomy: None Lacerations: None Suture Repair: none Est. Blood Loss (mL):  300ml (  Mom to postpartum.  Baby to NICU.  Isa RankinKimberly Niles [facility] 03/16/2016, 12:10 AM     Hillery HunterBodenheimer, GirlB Zierra [[id number]]  Delivery Note At 11:38 PM a viable female was delivered via Vaginal, Spontaneous Delivery (Presentation: Breech).  APGAR:7 9; weight 4 lb 9.4 oz (2080 g).   Placenta status: Intact, notably smaller than placenta for Twin A, Spontaneous.  Cord:  with the following complications: .  Cord pH: not collected Anesthesia: Epidural  Episiotomy: None Lacerations: None Suture Repair: none Est. Blood Loss (mL):  300ml (received cytotec prophylactic for pp trickle and initially boggy LUS)  Mom to postpartum.  Baby to NICU.  Isa RankinKimberly Niles [facility] 03/16/2016, 12:10 AM

## 2015-11-09 ENCOUNTER — Ambulatory Visit (INDEPENDENT_AMBULATORY_CARE_PROVIDER_SITE_OTHER): Payer: BLUE CROSS/BLUE SHIELD | Admitting: Advanced Practice Midwife

## 2015-11-09 VITALS — BP 127/78 | HR 92 | Wt 260.0 lb

## 2015-11-09 DIAGNOSIS — O09891 Supervision of other high risk pregnancies, first trimester: Secondary | ICD-10-CM

## 2015-11-09 DIAGNOSIS — O30042 Twin pregnancy, dichorionic/diamniotic, second trimester: Secondary | ICD-10-CM

## 2015-11-09 DIAGNOSIS — Z23 Encounter for immunization: Secondary | ICD-10-CM | POA: Diagnosis not present

## 2015-11-09 DIAGNOSIS — O09892 Supervision of other high risk pregnancies, second trimester: Secondary | ICD-10-CM

## 2015-11-09 DIAGNOSIS — O2341 Unspecified infection of urinary tract in pregnancy, first trimester: Secondary | ICD-10-CM

## 2015-11-09 DIAGNOSIS — O2342 Unspecified infection of urinary tract in pregnancy, second trimester: Secondary | ICD-10-CM

## 2015-11-09 NOTE — Progress Notes (Signed)
Subjective:  Katherine [name] is a 35 y.o. G3P1011 at 7969w6d being seen today for ongoing prenatal care.  She is currently monitored for the following issues for this high-risk pregnancy and has History of gallstones; Alternating constipation and diarrhea; Abdominal pain, chronic, epigastric; GERD (gastroesophageal reflux disease); Obesity; Elevated BP; Concentration deficit; Family history of DVT; Mass of breast, left; Depression; Supervision of other high risk pregnancies, first trimester; History of LEEP (loop electrosurgical excision procedure) of cervix complicating pregnancy in first trimester; AMA (advanced maternal age) multigravida 35+; Dichorionic diamniotic twin pregnancy in first trimester; Rh negative status during pregnancy in first trimester, antepartum; and Asymptomatic bacteriuria during pregnancy in first trimester on her problem list.  Patient reports no complaints.  Contractions: Not present. Vag. Bleeding: None.  Movement: Absent. Denies leaking of fluid.   The following portions of the patient's history were reviewed and updated as appropriate: allergies, current medications, past family history, past medical history, past social history, past surgical history and problem list. Problem list updated.  Objective:   Filed Vitals:   11/09/15 0928  BP: 127/78  Pulse: 92  Weight: 260 lb (117.935 kg)    Fetal Status: Fetal Heart Rate (bpm): 150/158 per informal BS US.   Movement: Absent     General:  Alert, oriented and cooperative. Patient is in no acute distress.  Skin: Skin is warm and dry. No rash noted.   Cardiovascular: Normal heart rate noted  Respiratory: Normal respiratory effort, no problems with respiration noted  Abdomen: Soft, gravid, appropriate for gestational age. Pain/Pressure: Absent     Pelvic: Vag. Bleeding: None Vag D/C Character: Thin   Cervical exam deferred        Extremities: Normal range of motion.  Edema: None  Mental Status: Normal mood and  affect. Normal behavior. Normal judgment and thought content.   Urinalysis:    N/N  Harmony neg NT normal. First trimester screen blood work not done w/ normal NIPS.   Assessment and Plan:  Pregnancy: G3P1011 at 2069w6d 1. Supervision of other high risk pregnancies, first trimester   2. UTI in pregnancy, antepartum, first trimester  - Culture, OB Urine  3. Di/di twin pregnancy  Preterm labor symptoms and general obstetric precautions including but not limited to vaginal bleeding, contractions, leaking of fluid and fetal movement were reviewed in detail with the patient. Please refer to After Visit Summary for other counseling recommendations.  Urine TOC Anatomy scan scheduled 11/27/15 Return in about 4 weeks (around 12/07/2015) for ROB.   Dorathy KinsmanVirginia Hoy Fallert, CNM

## 2015-11-09 NOTE — Addendum Note (Signed)
Addended by: Granville LewisLARK, Laurenashley Viar L on: 11/09/2015 10:21 AM   Modules accepted: Orders

## 2015-11-09 NOTE — Patient Instructions (Signed)
Pregnancy and Influenza Influenza, also called the flu, is an infection of the respiratory tract. If you are pregnant, you are more likely to catch the flu. You are also more likely to have a more serious case of the flu. This is because pregnancy lowers your body's ability to fight off infections (it weakens your immune system). It also puts additional stress on your heart and lungs, which makes you more likely to have complications. Having a bad case of the flu, especially with a high fever, can be dangerous for your developing baby. It can cause you to go into early labor. HOW DO PEOPLE GET THE FLU? The flu is caused by the influenza virus. This virus is common every year in the fall and winter. It spreads when virus particles get passed from person to person. You can get the virus if you are near a sick person who is coughing or sneezing. You can also get the virus if you touch something that has the virus on it and then touch your face. HOW CAN I PROTECT MYSELF AGAINST THE FLU?  Get a flu shot. The best way to prevent the flu is to get a flu shot before flu season starts. The flu shot is not dangerous for your developing baby. It may even help protect your baby from the flu for up to 6 months after birth. The flu shot is one type of flu vaccine. Another type is a nasal spray vaccine. Do not get the nasal spray vaccine. It is not approved for pregnancy.  Do not come in close contact with sick people.  Do not share food, drinks, or utensils with other people.  Wash your hands often. Use hand sanitizer when soap and water are not available. WHAT SHOULD I DO IF I HAVE FLU SYMPTOMS? If you have any flu symptoms, call your health care provider right away. Flu symptoms include:  Fever or chills.  Muscle aches.  Headache.  Sore throat.  Nasal congestion.  Cough.  Feeling tired.  Loss of appetite.  Vomiting.  Diarrhea. You may be able to take an antiviral medicine to keep the flu from  becoming severe and to shorten how long it lasts. WHAT SHOULD I DO AT HOME IF I AM DIAGNOSED WITH THE FLU?  Do not take any medicine, including cold or flu medicine, unless directed by your health care provider.  If you take antiviral medicine, make sure you finish it even if you start to feel better.  Drink enough fluid to keep your urine clear or pale yellow.  Get plenty of rest. WHEN WOULD I SEEK IMMEDIATE MEDICAL CARE IF I HAVE THE FLU?  You have trouble breathing.  You have chest pain.  You begin to have labor pains.  You have a high fever that does not go down after you take medicine.  You do not feel your baby move.  You have diarrhea or vomiting that will not go away.   This information is not intended to replace advice given to you by your health care provider. Make sure you discuss any questions you have with your health care provider.   Document Released: 08/19/2008 Document Revised: 10/22/2013 Document Reviewed: 09/13/2013 Elsevier Interactive Patient Education 2016 Elsevier Inc.  H1N1 Influenza (swine flu) Vaccine injection What is this medicine? H1N1 INFLUENZA (SWINE FLU) VACCINE (H1N1 in floo EN zuh (swahyn floo) vak SEEN) is a vaccine to protect from an infection with the pandemic H1N1 flu, also known as the swine flu. The   vaccine only helps protect you against this one strain of the flu. This vaccine does not help to the reduce the risk of getting other types of flu. You may also need to get the seasonal influenza virus vaccine. This medicine may be used for other purposes; ask your health care provider or pharmacist if you have questions. What should I tell my health care provider before I take this medicine? They need to know if you have any of these conditions: -Guillain-Barre syndrome -immune system problems -an unusual or allergic reaction to influenza vaccine, eggs, neomycin, polymyxin, other medicines, foods, dyes or preservatives -pregnant or trying to  get pregnant -breast-feeding How should I use this medicine? This vaccine is for injection into a muscle. It is given by a health care professional. A copy of Vaccine Information Statements will be given before each vaccination. Read this sheet carefully each time. The sheet may change frequently. Talk to your pediatrician regarding the use of this medicine in children. Special care may be needed. While this drug may be prescribed for children as young as 6 months for selected conditions, precautions do apply. Overdosage: If you think you have taken too much of this medicine contact a poison control center or emergency room at once. NOTE: This medicine is only for you. Do not share this medicine with others. What if I miss a dose? If needed, keep appointments for follow-up (booster) doses as directed. It is important not to miss your dose. Call your doctor or health care professional if you are unable to keep an appointment. What may interact with this medicine? -anakinra -medicines for organ transplant -medicines to treat cancer -other vaccines -rilonacept -steroid medicines like prednisone or cortisone -tumor necrosis factor (TNF) modifiers like adalimumab, etanercept, infliximab, golimumab, or certolizumab This list may not describe all possible interactions. Give your health care provider a list of all the medicines, herbs, non-prescription drugs, or dietary supplements you use. Also tell them if you smoke, drink alcohol, or use illegal drugs. Some items may interact with your medicine. What should I watch for while using this medicine? Report any side effects to your doctor right away. This vaccine lowers your risk of getting the pandemic H1N1 flu. You can get a milder H1N1 flu infection if you are around others with this flu. This flu vaccine will not protect against colds or other illnesses including other flu viruses. You may also need the seasonal influenza vaccine. What side effects  may I notice from receiving this medicine? Side effects that you should report to your doctor or health care professional as soon as possible: -allergic reactions like skin rash, itching or hives, swelling of the face, lips, or tongue -breathing problems -muscle weakness -unusual drooping or paralysis of face Side effects that usually do not require medical attention (Report these to your doctor or health care professional if they continue or are bothersome.): -chills -cough -headache -muscle aches and pains -runny or stuffy nose -sore throat -stomach upset -tiredness This list may not describe all possible side effects. Call your doctor for medical advice about side effects. You may report side effects to FDA at 1-800-FDA-1088. Where should I keep my medicine? This vaccine is only given in a clinic, pharmacy, doctor's office, or other health care setting and will not be stored at home. NOTE: This sheet is a summary. It may not cover all possible information. If you have questions about this medicine, talk to your doctor, pharmacist, or health care provider.    2016,   Elsevier/Gold Standard. (2008-09-16 16:49:51)   

## 2015-11-10 LAB — CULTURE, OB URINE
Colony Count: NO GROWTH
ORGANISM ID, BACTERIA: NO GROWTH

## 2015-11-27 ENCOUNTER — Encounter (HOSPITAL_COMMUNITY): Payer: Self-pay

## 2015-11-27 ENCOUNTER — Ambulatory Visit (HOSPITAL_COMMUNITY)
Admission: RE | Admit: 2015-11-27 | Discharge: 2015-11-27 | Disposition: A | Discharge status: Home / Self Care | Payer: BLUE CROSS/BLUE SHIELD | Source: Ambulatory Visit | Attending: Family Medicine | Admitting: Family Medicine

## 2015-11-27 VITALS — BP 132/80 | HR 94 | Wt 266.6 lb

## 2015-11-27 DIAGNOSIS — O09522 Supervision of elderly multigravida, second trimester: Secondary | ICD-10-CM | POA: Diagnosis not present

## 2015-11-27 DIAGNOSIS — Z36 Encounter for antenatal screening of mother: Secondary | ICD-10-CM | POA: Diagnosis not present

## 2015-11-27 DIAGNOSIS — O30042 Twin pregnancy, dichorionic/diamniotic, second trimester: Secondary | ICD-10-CM | POA: Insufficient documentation

## 2015-11-27 DIAGNOSIS — Z3A19 19 weeks gestation of pregnancy: Secondary | ICD-10-CM | POA: Insufficient documentation

## 2015-11-27 DIAGNOSIS — O30049 Twin pregnancy, dichorionic/diamniotic, unspecified trimester: Secondary | ICD-10-CM

## 2015-12-07 ENCOUNTER — Ambulatory Visit (INDEPENDENT_AMBULATORY_CARE_PROVIDER_SITE_OTHER): Payer: BLUE CROSS/BLUE SHIELD | Admitting: Advanced Practice Midwife

## 2015-12-07 VITALS — BP 106/63 | HR 84 | Wt 271.0 lb

## 2015-12-07 DIAGNOSIS — O09891 Supervision of other high risk pregnancies, first trimester: Secondary | ICD-10-CM

## 2015-12-07 DIAGNOSIS — L259 Unspecified contact dermatitis, unspecified cause: Secondary | ICD-10-CM

## 2015-12-07 DIAGNOSIS — O09892 Supervision of other high risk pregnancies, second trimester: Secondary | ICD-10-CM

## 2015-12-07 DIAGNOSIS — O26899 Other specified pregnancy related conditions, unspecified trimester: Secondary | ICD-10-CM

## 2015-12-07 DIAGNOSIS — O30042 Twin pregnancy, dichorionic/diamniotic, second trimester: Secondary | ICD-10-CM

## 2015-12-07 DIAGNOSIS — O30041 Twin pregnancy, dichorionic/diamniotic, first trimester: Secondary | ICD-10-CM

## 2015-12-07 DIAGNOSIS — G5600 Carpal tunnel syndrome, unspecified upper limb: Secondary | ICD-10-CM

## 2015-12-07 MED ORDER — IBUPROFEN 800 MG PO TABS: 800.0000 mg | ORAL_TABLET | Freq: Three times a day (TID) | ORAL | Status: DC | PRN

## 2015-12-07 MED ORDER — TRIAMCINOLONE ACETONIDE 0.1 % EX OINT: 1.0000 "application " | TOPICAL_OINTMENT | Freq: Two times a day (BID) | CUTANEOUS | Status: DC

## 2015-12-07 NOTE — Progress Notes (Signed)
Subjective:  Katherine [name] is a 35 y.o. G3P1011 at [redacted]w[redacted]d being seen today for ongoing prenatal care.  She is currently monitored for the following issues for this high-risk pregnancy and has History of gallstones; Alternating constipation and diarrhea; Abdominal pain, chronic, epigastric; GERD (gastroesophageal reflux disease); Obesity; Elevated BP; Concentration deficit; Family history of DVT; Mass of breast, left; Depression; Supervision of other high risk pregnancies, first trimester; History of LEEP (loop electrosurgical excision procedure) of cervix complicating pregnancy in first trimester; AMA (advanced maternal age) multigravida 35+; Dichorionic diamniotic twin pregnancy in first trimester; Rh negative status during pregnancy in first trimester, antepartum; and Asymptomatic bacteriuria during pregnancy in first trimester on her problem list.  Patient reports backache, carpal tunnel symptoms, no bleeding, no contractions, no cramping, no leaking and swelling in ankles since driving to mountains this weekend, rash on back of legs that partially improved w/ Cortizone cream. Chronic problems w/ carpel tunnel syndrome, but much worse  In pregnancy. Types all day at work. No improvement w/ hand brace. Contractions: Not present. Vag. Bleeding: None.  Movement: Absent. Denies leaking of fluid.   The following portions of the patient's history were reviewed and updated as appropriate: allergies, current medications, past family history, past medical history, past social history, past surgical history and problem list. Problem list updated.  Objective:   Filed Vitals:   12/07/15 0837  BP: 106/63  Pulse: 84  Weight: 271 lb (122.925 kg)    Fetal Status: Fetal Heart Rate (bpm): 152/160   Movement: Absent     General:  Alert, oriented and cooperative. Patient is in no acute distress.  Skin: Skin is warm and dry. Sparse, flat, pink, 1 mm rash noted on backs on calves C/W contact dermatitis vs dry  skin.   Cardiovascular: Normal heart rate noted  Respiratory: Normal respiratory effort, no problems with respiration noted  Abdomen: Soft, gravid, appropriate for gestational age. Pain/Pressure: Absent     Pelvic: Vag. Bleeding: None Vag D/C Character: Thin   Cervical exam deferred        Extremities: Normal range of motion.  Edema: Trace  Mental Status: Normal mood and affect. Normal behavior. Normal judgment and thought content.   Anatomy scan normal female/female. CL 4.4 cm  Urinalysis: Urine Protein: Trace Urine Glucose: Negative  Assessment and Plan:  Pregnancy: G3P1011 at [redacted]w[redacted]d  1. Dichorionic diamniotic twin pregnancy in first trimester - Growth [place] in 3 weeks  2. Supervision of other high risk pregnancies, first trimester   3. Carpal tunnel syndrome during pregnancy  - [facility] referral.  - ibuprofen (ADVIL,MOTRIN) 800 MG tablet; Take 1 tablet (800 mg total) by mouth every 8 (eight) hours as needed (Carpel Tunnel Syndrom. Do not use after [redacted] weeks gestation.).  Dispense: 30 tablet; Refill: 1  4. Contact dermatitis  - Eucerine cream - triamcinolone ointment (KENALOG) 0.1 %; Apply 1 application topically 2 (two) times daily.  Dispense: 30 g; Refill: 0  5. Sciatica pregnancy - PT referral, comfort measures.  - Ibuprofen sparingly in second trimester  Preterm labor symptoms and general obstetric precautions including but not limited to vaginal bleeding, contractions, leaking of fluid and fetal movement were reviewed in detail with the patient. Please refer to After Visit Summary for other counseling recommendations.  Return in about 4 weeks (around 01/04/2016).   Dorathy Kinsman, CNM

## 2015-12-07 NOTE — Patient Instructions (Addendum)
[facility]  [address] [place], [place] [address] [phone]  [facility] [address] [place], [place] [address] [phone]    Carpal Tunnel Syndrome Carpal tunnel syndrome is a condition that causes pain in your hand and arm. The carpal tunnel is a narrow area located on the palm side of your wrist. Repeated wrist motion or certain diseases may cause swelling within the tunnel. This swelling pinches the main nerve in the wrist (median nerve). CAUSES  This condition may be caused by:   Repeated wrist motions.  Wrist injuries.  Arthritis.  A cyst or tumor in the carpal tunnel.  Fluid buildup during pregnancy. Sometimes the cause of this condition is not known.  RISK FACTORS This condition is more likely to develop in:   People who have jobs that cause them to repeatedly move their wrists in the same motion, such as butchers and cashiers.  Women.  People with certain conditions, such as:  Diabetes.  Obesity.  An underactive thyroid (hypothyroidism).  Kidney failure. SYMPTOMS  Symptoms of this condition include:   A tingling feeling in your fingers, especially in your thumb, index, and middle fingers.  Tingling or numbness in your hand.  An aching feeling in your entire arm, especially when your wrist and elbow are bent for long periods of time.  Wrist pain that goes up your arm to your shoulder.  Pain that goes down into your palm or fingers.  A weak feeling in your hands. You may have trouble grabbing and holding items. Your symptoms may feel worse during the night.  DIAGNOSIS  This condition is diagnosed with a medical history and physical exam. You may also have tests, including:   An electromyogram (EMG). This test measures electrical signals sent by your nerves into the muscles.  X-rays. TREATMENT  Treatment for this condition includes:  Lifestyle changes. It is important to stop doing or  modify the activity that caused your condition.  Physical or occupational therapy.  Medicines for pain and inflammation. This may include medicine that is injected into your wrist.  A wrist splint.  Surgery. HOME CARE INSTRUCTIONS  If You Have a Splint:  Wear it as told by your health care provider. Remove it only as told by your health care provider.  Loosen the splint if your fingers become numb and tingle, or if they turn cold and blue.  Keep the splint clean and dry. General Instructions  Take over-the-counter and prescription medicines only as told by your health care provider.  Rest your wrist from any activity that may be causing your pain. If your condition is work related, talk to your employer about changes that can be made, such as getting a wrist pad to use while typing.  If directed, apply ice to the painful area:  Put ice in a plastic bag.  Place a towel between your skin and the bag.  Leave the ice on for 20 minutes, 2-3 times per day.  Keep all follow-up visits as told by your health care provider. This is important.  Do any exercises as told by your health care provider, physical therapist, or occupational therapist. SEEK MEDICAL CARE IF:   You have new symptoms.  Your pain is not controlled with medicines.  Your symptoms get worse.   This information is not intended to replace advice given to you by your health care provider. Make sure you discuss any questions you have with your health care  provider.   Document Released: 10/14/2000 Document Revised: 07/08/2015 Document Reviewed: 03/04/2015 Elsevier Interactive Patient Education [employer]

## 2015-12-25 ENCOUNTER — Encounter (HOSPITAL_COMMUNITY): Payer: Self-pay

## 2015-12-25 ENCOUNTER — Other Ambulatory Visit (HOSPITAL_COMMUNITY): Payer: Self-pay | Admitting: Maternal and Fetal Medicine

## 2015-12-25 ENCOUNTER — Ambulatory Visit (HOSPITAL_COMMUNITY)
Admission: RE | Admit: 2015-12-25 | Discharge: 2015-12-25 | Disposition: A | Discharge status: Home / Self Care | Payer: BLUE CROSS/BLUE SHIELD | Source: Ambulatory Visit | Attending: Family Medicine | Admitting: Family Medicine

## 2015-12-25 VITALS — BP 125/66 | HR 76 | Wt 271.0 lb

## 2015-12-25 DIAGNOSIS — O30049 Twin pregnancy, dichorionic/diamniotic, unspecified trimester: Secondary | ICD-10-CM

## 2015-12-25 DIAGNOSIS — O30042 Twin pregnancy, dichorionic/diamniotic, second trimester: Secondary | ICD-10-CM | POA: Insufficient documentation

## 2015-12-25 DIAGNOSIS — O09522 Supervision of elderly multigravida, second trimester: Secondary | ICD-10-CM | POA: Insufficient documentation

## 2015-12-25 DIAGNOSIS — O30041 Twin pregnancy, dichorionic/diamniotic, first trimester: Secondary | ICD-10-CM

## 2015-12-25 DIAGNOSIS — Z36 Encounter for antenatal screening of mother: Secondary | ICD-10-CM | POA: Insufficient documentation

## 2015-12-25 DIAGNOSIS — O360112 Maternal care for anti-D [Rh] antibodies, first trimester, fetus 2: Secondary | ICD-10-CM

## 2015-12-25 DIAGNOSIS — O09891 Supervision of other high risk pregnancies, first trimester: Secondary | ICD-10-CM

## 2015-12-25 DIAGNOSIS — Z3A22 22 weeks gestation of pregnancy: Secondary | ICD-10-CM | POA: Insufficient documentation

## 2015-12-30 ENCOUNTER — Encounter: Payer: Self-pay | Admitting: *Deleted

## 2016-01-04 ENCOUNTER — Ambulatory Visit (INDEPENDENT_AMBULATORY_CARE_PROVIDER_SITE_OTHER): Payer: BLUE CROSS/BLUE SHIELD | Admitting: Advanced Practice Midwife

## 2016-01-04 VITALS — BP 122/72 | HR 85 | Temp 97.7°F | Wt 272.0 lb

## 2016-01-04 DIAGNOSIS — J029 Acute pharyngitis, unspecified: Secondary | ICD-10-CM | POA: Diagnosis not present

## 2016-01-04 DIAGNOSIS — O09521 Supervision of elderly multigravida, first trimester: Secondary | ICD-10-CM

## 2016-01-04 DIAGNOSIS — O09522 Supervision of elderly multigravida, second trimester: Secondary | ICD-10-CM

## 2016-01-04 NOTE — Progress Notes (Signed)
Pt would like a trep done on throat today

## 2016-01-05 ENCOUNTER — Encounter: Payer: Self-pay | Admitting: Advanced Practice Midwife

## 2016-01-05 NOTE — Progress Notes (Signed)
Subjective:  Katherine [name] is a 35 y.o. G3P1011 at 5523w0d being seen today for ongoing prenatal care.  She is currently monitored for the following issues for this high-risk pregnancy and has History of gallstones; Alternating constipation and diarrhea; Abdominal pain, chronic, epigastric; GERD (gastroesophageal reflux disease); Obesity; Elevated BP; Concentration deficit; Family history of DVT; Mass of breast, left; Depression; Supervision of other high risk pregnancies, first trimester; History of LEEP (loop electrosurgical excision procedure) of cervix complicating pregnancy in first trimester; AMA (advanced maternal age) multigravida 35+; Dichorionic diamniotic twin pregnancy in first trimester; Rh negative status during pregnancy in first trimester, antepartum; and Asymptomatic bacteriuria during pregnancy in first trimester on her problem list.  Patient reports no complaints.  Contractions: Not present. Vag. Bleeding: None.  Movement: Absent. Denies leaking of fluid.   The following portions of the patient's history were reviewed and updated as appropriate: allergies, current medications, past family history, past medical history, past social history, past surgical history and problem list. Problem list updated.  Objective:   Filed Vitals:   01/04/16 0947  BP: 122/72  Pulse: 85  Temp: 97.7 F (36.5 C)  Weight: 272 lb (123.378 kg)    Fetal Status: Fetal Heart Rate (bpm): 149/153   Movement: Absent     General:  Alert, oriented and cooperative. Patient is in no acute distress.  Skin: Skin is warm and dry. No rash noted.   Cardiovascular: Normal heart rate noted  Respiratory: Normal respiratory effort, no problems with respiration noted  Abdomen: Soft, gravid, appropriate for gestational age. Pain/Pressure: Absent     Pelvic: Vag. Bleeding: None Vag D/C Character: Thin   Cervical exam deferred        Extremities: Normal range of motion.  Edema: Mild pitting, slight indentation   Mental Status: Normal mood and affect. Normal behavior. Normal judgment and thought content.   Urinalysis: Urine Protein: Negative Urine Glucose: Negative  Assessment and Plan:  Pregnancy: G3P1011 at 5923w0d  1. Sore throat      Feels better now - Culture, Group A Strep  Preterm labor symptoms and general obstetric precautions including but not limited to vaginal bleeding, contractions, leaking of fluid and fetal movement were reviewed in detail with the patient. Please refer to After Visit Summary for other counseling recommendations.   RTO 4 weeks or as needed  Aviva SignsMarie L Talli Kimmer, CNM

## 2016-01-05 NOTE — Patient Instructions (Signed)

## 2016-01-06 LAB — CULTURE, GROUP A STREP: ORGANISM ID, BACTERIA: NORMAL

## 2016-01-14 ENCOUNTER — Other Ambulatory Visit: Payer: Self-pay | Admitting: Family Medicine

## 2016-01-14 ENCOUNTER — Ambulatory Visit (INDEPENDENT_AMBULATORY_CARE_PROVIDER_SITE_OTHER): Payer: BLUE CROSS/BLUE SHIELD | Admitting: Obstetrics & Gynecology

## 2016-01-14 VITALS — BP 125/75 | HR 88 | Wt 275.0 lb

## 2016-01-14 DIAGNOSIS — O09891 Supervision of other high risk pregnancies, first trimester: Secondary | ICD-10-CM

## 2016-01-14 DIAGNOSIS — O09892 Supervision of other high risk pregnancies, second trimester: Secondary | ICD-10-CM

## 2016-01-14 DIAGNOSIS — J0100 Acute maxillary sinusitis, unspecified: Secondary | ICD-10-CM

## 2016-01-14 MED ORDER — DM-GUAIFENESIN ER 30-600 MG PO TB12: 1.0000 | ORAL_TABLET | Freq: Two times a day (BID) | ORAL | Status: DC

## 2016-01-14 MED ORDER — PSEUDOEPHEDRINE HCL 30 MG PO TABS: 30.0000 mg | ORAL_TABLET | Freq: Three times a day (TID) | ORAL | Status: DC | PRN

## 2016-01-14 MED ORDER — AMOXICILLIN 500 MG PO CAPS: 500.0000 mg | ORAL_CAPSULE | Freq: Three times a day (TID) | ORAL | Status: DC

## 2016-01-14 NOTE — Progress Notes (Signed)
Subjective:  Katherine [name] is a 35 y.o. G3P1011 at 2224w2d being seen today for ongoing prenatal care.  She is currently monitored for the following issues for this high-risk pregnancy and has History of gallstones; Alternating constipation and diarrhea; Abdominal pain, chronic, epigastric; GERD (gastroesophageal reflux disease); Obesity; Elevated BP; Concentration deficit; Family history of DVT; Mass of breast, left; Depression; Supervision of other high risk pregnancies, first trimester; History of LEEP (loop electrosurgical excision procedure) of cervix complicating pregnancy in first trimester; AMA (advanced maternal age) multigravida 35+; Dichorionic diamniotic twin pregnancy in first trimester; Rh negative status during pregnancy in first trimester, antepartum; and Asymptomatic bacteriuria during pregnancy in first trimester on her problem list.  Patient reports sinus pressure, nasal congestion, rhinorrhea, cough..   .  .   . Denies leaking of fluid.   The following portions of the patient's history were reviewed and updated as appropriate: allergies, current medications, past family history, past medical history, past social history, past surgical history and problem list. Problem list updated.  Objective:   Filed Vitals:   01/14/16 0937  BP: 125/75  Pulse: 88  Weight: 275 lb (124.739 kg)    Fetal Status:  General:  Alert, oriented and cooperative. Patient is in no acute distress.  Skin: Skin is warm and dry. No rash noted.   Cardiovascular: Normal heart rate noted  Respiratory: Normal respiratory effort, no problems with respiration noted  Abdomen: Soft, gravid, appropriate for gestational age.       Pelvic:       Cervical exam deferred        Extremities: Normal range of motion.     Mental Status: Normal mood and affect. Normal behavior. Normal judgment and thought content.  TM is clear Throat--no abscess or evidnece of tonsillitis  Urinalysis: Urine Protein: Trace Urine  Glucose: Negative  Assessment and Plan:  Pregnancy: G3P1011 at 624w2d  1. Supervision of other high risk pregnancies, first trimester -URI persistent for several weeks.  Will treat for sinus infection.  Pt should take 3 day course of sudafed and mucinex for symptoms.  Amoxicillin 500 tid for one week. If not better in one week or worsens, pt should return to the office.  Pt denies fever of other signs of flu.  Preterm labor symptoms and general obstetric precautions including but not limited to vaginal bleeding, contractions, leaking of fluid and fetal movement were reviewed in detail with the patient. Please refer to After Visit Summary for other counseling recommendations.  Return in about 3 weeks (around 02/04/2016).   Lesly DukesKelly H Marques Ericson, MD

## 2016-01-14 NOTE — Progress Notes (Signed)
Needs refill on Protonix. Having flu like symptoms.

## 2016-01-15 MED ORDER — PANTOPRAZOLE SODIUM 40 MG PO TBEC: 40.0000 mg | DELAYED_RELEASE_TABLET | Freq: Every day | ORAL | Status: DC

## 2016-01-15 NOTE — Addendum Note (Signed)
Addended by: Thom ChimesHENRY, Ivo Moga M on: 01/15/2016 09:33 AM   Modules accepted: Orders

## 2016-01-20 ENCOUNTER — Ambulatory Visit: Payer: BLUE CROSS/BLUE SHIELD | Admitting: Family Medicine

## 2016-01-22 ENCOUNTER — Other Ambulatory Visit (HOSPITAL_COMMUNITY): Payer: Self-pay | Admitting: Obstetrics and Gynecology

## 2016-01-22 ENCOUNTER — Encounter (HOSPITAL_COMMUNITY): Payer: Self-pay

## 2016-01-22 ENCOUNTER — Ambulatory Visit (HOSPITAL_COMMUNITY)
Admission: RE | Admit: 2016-01-22 | Discharge: 2016-01-22 | Disposition: A | Discharge status: Home / Self Care | Payer: BLUE CROSS/BLUE SHIELD | Source: Ambulatory Visit | Attending: Family Medicine | Admitting: Family Medicine

## 2016-01-22 VITALS — BP 116/61 | HR 81 | Wt 278.0 lb

## 2016-01-22 DIAGNOSIS — Z3A26 26 weeks gestation of pregnancy: Secondary | ICD-10-CM

## 2016-01-22 DIAGNOSIS — O09891 Supervision of other high risk pregnancies, first trimester: Secondary | ICD-10-CM

## 2016-01-22 DIAGNOSIS — O09521 Supervision of elderly multigravida, first trimester: Secondary | ICD-10-CM

## 2016-01-22 DIAGNOSIS — Z36 Encounter for antenatal screening of mother: Secondary | ICD-10-CM | POA: Insufficient documentation

## 2016-01-22 DIAGNOSIS — O09522 Supervision of elderly multigravida, second trimester: Secondary | ICD-10-CM | POA: Diagnosis not present

## 2016-01-22 DIAGNOSIS — O30042 Twin pregnancy, dichorionic/diamniotic, second trimester: Secondary | ICD-10-CM

## 2016-01-22 DIAGNOSIS — O99212 Obesity complicating pregnancy, second trimester: Secondary | ICD-10-CM | POA: Diagnosis not present

## 2016-01-22 DIAGNOSIS — O30049 Twin pregnancy, dichorionic/diamniotic, unspecified trimester: Secondary | ICD-10-CM

## 2016-01-22 DIAGNOSIS — O360112 Maternal care for anti-D [Rh] antibodies, first trimester, fetus 2: Secondary | ICD-10-CM

## 2016-01-22 DIAGNOSIS — O30041 Twin pregnancy, dichorionic/diamniotic, first trimester: Secondary | ICD-10-CM

## 2016-02-01 ENCOUNTER — Ambulatory Visit (INDEPENDENT_AMBULATORY_CARE_PROVIDER_SITE_OTHER): Payer: BLUE CROSS/BLUE SHIELD | Admitting: Advanced Practice Midwife

## 2016-02-01 VITALS — BP 127/68 | HR 78 | Wt 280.0 lb

## 2016-02-01 DIAGNOSIS — Z23 Encounter for immunization: Secondary | ICD-10-CM | POA: Diagnosis not present

## 2016-02-01 DIAGNOSIS — O30002 Twin pregnancy, unspecified number of placenta and unspecified number of amniotic sacs, second trimester: Secondary | ICD-10-CM

## 2016-02-01 DIAGNOSIS — O1202 Gestational edema, second trimester: Secondary | ICD-10-CM

## 2016-02-01 DIAGNOSIS — O09891 Supervision of other high risk pregnancies, first trimester: Secondary | ICD-10-CM

## 2016-02-01 DIAGNOSIS — Z36 Encounter for antenatal screening of mother: Secondary | ICD-10-CM

## 2016-02-01 DIAGNOSIS — O360120 Maternal care for anti-D [Rh] antibodies, second trimester, not applicable or unspecified: Secondary | ICD-10-CM

## 2016-02-01 DIAGNOSIS — O30041 Twin pregnancy, dichorionic/diamniotic, first trimester: Secondary | ICD-10-CM

## 2016-02-01 MED ORDER — MEDICAL COMPRESSION STOCKINGS MISC: 1.0000 | Freq: Every day | Status: DC

## 2016-02-01 NOTE — Progress Notes (Signed)
Subjective:  Katherine [name] is a 35 y.o. G3P1011 at 7458w6d being seen today for ongoing prenatal care.  She is currently monitored for the following issues for this high-risk pregnancy and has History of gallstones; Alternating constipation and diarrhea; Abdominal pain, chronic, epigastric; GERD (gastroesophageal reflux disease); Obesity; Elevated BP; Concentration deficit; Family history of DVT; Mass of breast, left; Depression; Supervision of other high risk pregnancies, first trimester; History of LEEP (loop electrosurgical excision procedure) of cervix complicating pregnancy in first trimester; AMA (advanced maternal age) multigravida 35+; Dichorionic diamniotic twin pregnancy in first trimester; Rh negative status during pregnancy in first trimester, antepartum; and Asymptomatic bacteriuria during pregnancy in first trimester on her problem list.  Patient reports carpal tunnel symptoms and left leg numbness, pedal edema. .  Contractions: Not present. Vag. Bleeding: None.  Movement: Present. Denies leaking of fluid.   The following portions of the patient's history were reviewed and updated as appropriate: allergies, current medications, past family history, past medical history, past social history, past surgical history and problem list. Problem list updated.  Objective:   Filed Vitals:   02/01/16 0851  BP: 127/68  Pulse: 78  Weight: 280 lb (127.007 kg)    Fetal Status: Fetal Heart Rate (bpm): 141/152   Movement: Present     General:  Alert, oriented and cooperative. Patient is in no acute distress.  Skin: Skin is warm and dry. No rash noted.   Cardiovascular: Normal heart rate noted  Respiratory: Normal respiratory effort, no problems with respiration noted  Abdomen: Soft, gravid, appropriate for gestational age. Pain/Pressure: Present     Pelvic: Vag. Bleeding: None Vag D/C Character: Curdy   Cervical exam deferred        Extremities: Normal range of motion.  Edema: Mild  pitting, slight indentation  Mental Status: Normal mood and affect. Normal behavior. Normal judgment and thought content.   Urinalysis: Urine Protein: Trace Urine Glucose: Negative  Assessment and Plan:  Pregnancy: G3P1011 at 7658w6d  1. Twin gestation in second trimester, unspecified placenta and amniotic sac number  - Glucose Tolerance, 1 HR (50g) - CBC - HIV antibody (with reflex) - RPR - Antibody screen; Future - Antibody screen  2. Rh negative state in antepartum period, second trimester, not applicable or unspecified fetus  - Antibody screen; Future - Antibody screen  3. Supervision of other high risk pregnancies, first trimester  - Tdap vaccine greater than or equal to 7yo IM  4. Dichorionic diamniotic twin pregnancy in first trimester   5. Edema during pregnancy in second trimester  - Elastic Bandages & Supports (MEDICAL COMPRESSION STOCKINGS) MISC; 1 Device by Does not apply route daily.  Dispense: 1 each; Refill: 1  Preterm labor symptoms and general obstetric precautions including but not limited to vaginal bleeding, contractions, leaking of fluid and fetal movement were reviewed in detail with the patient. Please refer to After Visit Summary for other counseling recommendations.  Return in about 2 weeks (around 02/15/2016).  Decrease sodium. Elevate feet.    Katherine [name], CNM

## 2016-02-01 NOTE — Patient Instructions (Signed)
Tdap Vaccine (Tetanus, Diphtheria and Pertussis): What You Need to Know 1. Why get vaccinated? Tetanus, diphtheria and pertussis are very serious diseases. Tdap vaccine can protect us from these diseases. And, Tdap vaccine given to pregnant women can protect newborn babies against pertussis. TETANUS (Lockjaw) is rare in the [place] today. It causes painful muscle tightening and stiffness, usually all over the body.  It can lead to tightening of muscles in the head and neck so you can't open your mouth, swallow, or sometimes even breathe. Tetanus kills about 1 out of 10 people who are infected even after receiving the best medical care. DIPHTHERIA is also rare in the [place] today. It can cause a thick coating to form in the back of the throat.  It can lead to breathing problems, heart failure, paralysis, and death. PERTUSSIS (Whooping Cough) causes severe coughing spells, which can cause difficulty breathing, vomiting and disturbed sleep.  It can also lead to weight loss, incontinence, and rib fractures. Up to 2 in 100 adolescents and 5 in 100 adults with pertussis are hospitalized or have complications, which could include pneumonia or death. These diseases are caused by bacteria. Diphtheria and pertussis are spread from person to person through secretions from coughing or sneezing. Tetanus enters the body through cuts, scratches, or wounds. Before vaccines, as many as 200,000 cases of diphtheria, 200,000 cases of pertussis, and hundreds of cases of tetanus, were reported in the [place] each year. Since vaccination began, reports of cases for tetanus and diphtheria have dropped by about 99% and for pertussis by about 80%. 2. Tdap vaccine Tdap vaccine can protect adolescents and adults from tetanus, diphtheria, and pertussis. One dose of Tdap is routinely given at age 11 or 12. People who did not get Tdap at that age should get it as soon as possible. Tdap is especially important  for healthcare professionals and anyone having close contact with a baby younger than 12 months. Pregnant women should get a dose of Tdap during every pregnancy, to protect the newborn from pertussis. Infants are most at risk for severe, life-threatening complications from pertussis. Another vaccine, called Td, protects against tetanus and diphtheria, but not pertussis. A Td booster should be given every 10 years. Tdap may be given as one of these boosters if you have never gotten Tdap before. Tdap may also be given after a severe cut or burn to prevent tetanus infection. Your doctor or the person giving you the vaccine can give you more information. Tdap may safely be given at the same time as other vaccines. 3. Some people should not get this vaccine  A person who has ever had a life-threatening allergic reaction after a previous dose of any diphtheria, tetanus or pertussis containing vaccine, OR has a severe allergy to any part of this vaccine, should not get Tdap vaccine. Tell the person giving the vaccine about any severe allergies.  Anyone who had coma or long repeated seizures within 7 days after a childhood dose of DTP or DTaP, or a previous dose of Tdap, should not get Tdap, unless a cause other than the vaccine was found. They can still get Td.  Talk to your doctor if you:  have seizures or another nervous system problem,  had severe pain or swelling after any vaccine containing diphtheria, tetanus or pertussis,  ever had a condition called Guillain-Barr Syndrome (GBS),  aren't feeling well on the day the shot is scheduled. 4. Risks With any medicine, including vaccines, there is   a chance of side effects. These are usually mild and go away on their own. Serious reactions are also possible but are rare. Most people who get Tdap vaccine do not have any problems with it. Mild problems following Tdap (Did not interfere with activities)  Pain where the shot was given (about 3 in 4  adolescents or 2 in 3 adults)  Redness or swelling where the shot was given (about 1 person in 5)  Mild fever of at least 100.4F (up to about 1 in 25 adolescents or 1 in 100 adults)  Headache (about 3 or 4 people in 10)  Tiredness (about 1 person in 3 or 4)  Nausea, vomiting, diarrhea, stomach ache (up to 1 in 4 adolescents or 1 in 10 adults)  Chills, sore joints (about 1 person in 10)  Body aches (about 1 person in 3 or 4)  Rash, swollen glands (uncommon) Moderate problems following Tdap (Interfered with activities, but did not require medical attention)  Pain where the shot was given (up to 1 in 5 or 6)  Redness or swelling where the shot was given (up to about 1 in 16 adolescents or 1 in 12 adults)  Fever over 102F (about 1 in 100 adolescents or 1 in 250 adults)  Headache (about 1 in 7 adolescents or 1 in 10 adults)  Nausea, vomiting, diarrhea, stomach ache (up to 1 or 3 people in 100)  Swelling of the entire arm where the shot was given (up to about 1 in 500). Severe problems following Tdap (Unable to perform usual activities; required medical attention)  Swelling, severe pain, bleeding and redness in the arm where the shot was given (rare). Problems that could happen after any vaccine:  People sometimes faint after a medical procedure, including vaccination. Sitting or lying down for about 15 minutes can help prevent fainting, and injuries caused by a fall. Tell your doctor if you feel dizzy, or have vision changes or ringing in the ears.  Some people get severe pain in the shoulder and have difficulty moving the arm where a shot was given. This happens very rarely.  Any medication can cause a severe allergic reaction. Such reactions from a vaccine are very rare, estimated at fewer than 1 in a million doses, and would happen within a few minutes to a few hours after the vaccination. As with any medicine, there is a very remote chance of a vaccine causing a serious  injury or death. The safety of vaccines is always being monitored. For more information, visit: www.cdc.gov/vaccinesafety/ 5. What if there is a serious problem? What should I look for?  Look for anything that concerns you, such as signs of a severe allergic reaction, very high fever, or unusual behavior.  Signs of a severe allergic reaction can include hives, swelling of the face and throat, difficulty breathing, a fast heartbeat, dizziness, and weakness. These would usually start a few minutes to a few hours after the vaccination. What should I do?  If you think it is a severe allergic reaction or other emergency that can't wait, call 9-1-1 or get the person to the nearest hospital. Otherwise, call your doctor.  Afterward, the reaction should be reported to the Vaccine Adverse Event Reporting System (VAERS). Your doctor might file this report, or you can do it yourself through the VAERS web site at www.vaers.hhs.gov, or by calling 1-800-822-7967. VAERS does not give medical advice.  6. The National Vaccine Injury Compensation Program The National Vaccine Injury Compensation Program (  VICP) is a federal program that was created to compensate people who may have been injured by certain vaccines. Persons who believe they may have been injured by a vaccine can learn about the program and about filing a claim by calling 1-800-338-2382 or visiting the VICP website at www.hrsa.gov/vaccinecompensation. There is a time limit to file a claim for compensation. 7. How can I learn more?  Ask your doctor. He or she can give you the vaccine package insert or suggest other sources of information.  Call your local or state health department.  Contact the Centers for Disease Control and Prevention (CDC):  Call 1-800-232-4636 (1-800-CDC-INFO) or  Visit CDC's website at www.cdc.gov/vaccines CDC Tdap Vaccine VIS (12/24/13)   This information is not intended to replace advice given to you by your health care  provider. Make sure you discuss any questions you have with your health care provider.   Document Released: 04/17/2012 Document Revised: 11/07/2014 Document Reviewed: 01/29/2014 Elsevier Interactive Patient Education 2016 [employer] Inc.  

## 2016-02-02 ENCOUNTER — Other Ambulatory Visit: Payer: Self-pay | Admitting: Advanced Practice Midwife

## 2016-02-02 ENCOUNTER — Telehealth: Payer: Self-pay | Admitting: *Deleted

## 2016-02-02 DIAGNOSIS — O99012 Anemia complicating pregnancy, second trimester: Secondary | ICD-10-CM

## 2016-02-02 LAB — GLUCOSE TOLERANCE, 1 HOUR (50G) W/O FASTING: GLUCOSE, 1 HR, GESTATIONAL: 117 mg/dL (ref <140)

## 2016-02-02 LAB — CBC
HCT: 30.4 % — ABNORMAL LOW (ref 35.0–45.0)
Hemoglobin: 10 g/dL — ABNORMAL LOW (ref 11.7–15.5)
MCH: 31.2 pg (ref 27.0–33.0)
MCHC: 32.9 g/dL (ref 32.0–36.0)
MCV: 94.7 fL (ref 80.0–100.0)
MPV: 9.1 fL (ref 7.5–12.5)
PLATELETS: 308 10*3/uL (ref 140–400)
RBC: 3.21 MIL/uL — AB (ref 3.80–5.10)
RDW: 15.6 % — AB (ref 11.0–15.0)
WBC: 8.2 10*3/uL (ref 3.8–10.8)

## 2016-02-02 LAB — HIV ANTIBODY (ROUTINE TESTING W REFLEX): HIV: NONREACTIVE

## 2016-02-02 LAB — ANTIBODY SCREEN: ANTIBODY SCREEN: NEGATIVE

## 2016-02-02 MED ORDER — INTEGRA F 125-1 MG PO CAPS: 1.0000 | ORAL_CAPSULE | Freq: Every day | ORAL | Status: DC

## 2016-02-02 NOTE — Telephone Encounter (Signed)
Pt notified of Hgb results and she is aware of RX for iron that is at her pharmacy.  She was also notified of normal 1 hr GTT.

## 2016-02-02 NOTE — Progress Notes (Signed)
Recommend starting iron due to Hgb 10.0 and twins. Rx Integra F sent.

## 2016-02-03 LAB — RPR

## 2016-02-15 ENCOUNTER — Encounter: Payer: Self-pay | Admitting: Advanced Practice Midwife

## 2016-02-15 ENCOUNTER — Ambulatory Visit (INDEPENDENT_AMBULATORY_CARE_PROVIDER_SITE_OTHER): Payer: BLUE CROSS/BLUE SHIELD | Admitting: Advanced Practice Midwife

## 2016-02-15 VITALS — BP 130/74 | HR 87 | Wt 286.0 lb

## 2016-02-15 DIAGNOSIS — Z3483 Encounter for supervision of other normal pregnancy, third trimester: Secondary | ICD-10-CM

## 2016-02-15 DIAGNOSIS — O30043 Twin pregnancy, dichorionic/diamniotic, third trimester: Secondary | ICD-10-CM

## 2016-02-15 DIAGNOSIS — Z9889 Other specified postprocedural states: Secondary | ICD-10-CM

## 2016-02-15 DIAGNOSIS — O3441 Maternal care for other abnormalities of cervix, first trimester: Secondary | ICD-10-CM

## 2016-02-15 NOTE — Patient Instructions (Signed)

## 2016-02-15 NOTE — Progress Notes (Signed)
Subjective:  Katherine [name] is a 35 y.o. G3P1011 at 45104w6d being seen today for ongoing prenatal care.  She is currently monitored for the following issues for this high-risk pregnancy and has History of gallstones; Alternating constipation and diarrhea; Abdominal pain, chronic, epigastric; GERD (gastroesophageal reflux disease); Obesity; Elevated BP; Concentration deficit; Family history of DVT; Mass of breast, left; Depression; Supervision of other high risk pregnancies, first trimester; History of LEEP (loop electrosurgical excision procedure) of cervix complicating pregnancy in first trimester; AMA (advanced maternal age) multigravida 35+; Dichorionic diamniotic twin pregnancy in first trimester; Rh negative status during pregnancy in first trimester, antepartum; and Asymptomatic bacteriuria during pregnancy in first trimester on her problem list.  Patient reports no complaints.  Contractions: Not present. Vag. Bleeding: None.  Movement: Present. Denies leaking of fluid.   The following portions of the patient's history were reviewed and updated as appropriate: allergies, current medications, past family history, past medical history, past social history, past surgical history and problem list. Problem list updated.  Objective:   Filed Vitals:   02/15/16 0832  BP: 130/74  Pulse: 87  Weight: 129.729 kg (286 lb)    Fetal Status: Fetal Heart Rate (bpm): 150/158   Movement: Present     General:  Alert, oriented and cooperative. Patient is in no acute distress.  Skin: Skin is warm and dry. No rash noted.   Cardiovascular: Normal heart rate noted  Respiratory: Normal respiratory effort, no problems with respiration noted  Abdomen: Soft, gravid, appropriate for gestational age. Pain/Pressure: Present     Pelvic: Vag. Bleeding: None Vag D/C Character: Thin   Cervical exam deferred        Extremities: Normal range of motion.  Edema: Moderate pitting, indentation subsides rapidly  Mental  Status: Normal mood and affect. Normal behavior. Normal judgment and thought content.   Urinalysis: Urine Protein: 2+ Urine Glucose: Negative  Assessment and Plan:  Pregnancy: G3P1011 at 37104w6d  1. History of LEEP (loop electrosurgical excision procedure) of cervix complicating pregnancy in first trimester      Cervical length 3.1cm  Preterm labor symptoms and general obstetric precautions including but not limited to vaginal bleeding, contractions, leaking of fluid and fetal movement were reviewed in detail with the patient. Please refer to After Visit Summary for other counseling recommendations.  RTO 2 weeks  Aviva SignsMarie L Babbie [name], CNM

## 2016-02-19 ENCOUNTER — Encounter (HOSPITAL_COMMUNITY): Payer: Self-pay

## 2016-02-19 ENCOUNTER — Ambulatory Visit (HOSPITAL_COMMUNITY)
Admission: RE | Admit: 2016-02-19 | Discharge: 2016-02-19 | Disposition: A | Discharge status: Home / Self Care | Payer: BLUE CROSS/BLUE SHIELD | Source: Ambulatory Visit | Attending: Advanced Practice Midwife | Admitting: Advanced Practice Midwife

## 2016-02-19 ENCOUNTER — Other Ambulatory Visit (HOSPITAL_COMMUNITY): Payer: Self-pay | Admitting: Maternal and Fetal Medicine

## 2016-02-19 VITALS — BP 135/65 | HR 83 | Wt 286.6 lb

## 2016-02-19 DIAGNOSIS — O30043 Twin pregnancy, dichorionic/diamniotic, third trimester: Secondary | ICD-10-CM | POA: Diagnosis present

## 2016-02-19 DIAGNOSIS — O30041 Twin pregnancy, dichorionic/diamniotic, first trimester: Secondary | ICD-10-CM

## 2016-02-19 DIAGNOSIS — Z3A30 30 weeks gestation of pregnancy: Secondary | ICD-10-CM | POA: Diagnosis not present

## 2016-02-19 DIAGNOSIS — O99213 Obesity complicating pregnancy, third trimester: Secondary | ICD-10-CM | POA: Diagnosis not present

## 2016-02-19 DIAGNOSIS — E669 Obesity, unspecified: Secondary | ICD-10-CM | POA: Diagnosis not present

## 2016-02-19 DIAGNOSIS — O09523 Supervision of elderly multigravida, third trimester: Secondary | ICD-10-CM | POA: Diagnosis not present

## 2016-02-19 DIAGNOSIS — O30049 Twin pregnancy, dichorionic/diamniotic, unspecified trimester: Secondary | ICD-10-CM

## 2016-02-29 ENCOUNTER — Encounter: Payer: Self-pay | Admitting: *Deleted

## 2016-02-29 ENCOUNTER — Ambulatory Visit (INDEPENDENT_AMBULATORY_CARE_PROVIDER_SITE_OTHER): Payer: BLUE CROSS/BLUE SHIELD | Admitting: Obstetrics & Gynecology

## 2016-02-29 VITALS — BP 140/81 | HR 89 | Wt 293.0 lb

## 2016-02-29 DIAGNOSIS — O30041 Twin pregnancy, dichorionic/diamniotic, first trimester: Secondary | ICD-10-CM

## 2016-02-29 DIAGNOSIS — O3441 Maternal care for other abnormalities of cervix, first trimester: Secondary | ICD-10-CM

## 2016-02-29 DIAGNOSIS — E669 Obesity, unspecified: Secondary | ICD-10-CM

## 2016-02-29 DIAGNOSIS — F32A Depression, unspecified: Secondary | ICD-10-CM

## 2016-02-29 DIAGNOSIS — O133 Gestational [pregnancy-induced] hypertension without significant proteinuria, third trimester: Secondary | ICD-10-CM

## 2016-02-29 DIAGNOSIS — O09891 Supervision of other high risk pregnancies, first trimester: Secondary | ICD-10-CM

## 2016-02-29 DIAGNOSIS — Z9889 Other specified postprocedural states: Secondary | ICD-10-CM

## 2016-02-29 DIAGNOSIS — O360112 Maternal care for anti-D [Rh] antibodies, first trimester, fetus 2: Secondary | ICD-10-CM

## 2016-02-29 DIAGNOSIS — O163 Unspecified maternal hypertension, third trimester: Secondary | ICD-10-CM

## 2016-02-29 DIAGNOSIS — O09521 Supervision of elderly multigravida, first trimester: Secondary | ICD-10-CM

## 2016-02-29 DIAGNOSIS — F329 Major depressive disorder, single episode, unspecified: Secondary | ICD-10-CM

## 2016-02-29 NOTE — Progress Notes (Signed)
Subjective:  Katherine [name] is a 35 y.o. G3P1011 at 374w6d being seen today for ongoing prenatal care.  She is currently monitored for the following issues for this high-risk pregnancy and has History of gallstones; Alternating constipation and diarrhea; Abdominal pain, chronic, epigastric; GERD (gastroesophageal reflux disease); Obesity; Elevated BP; Concentration deficit; Family history of DVT; Mass of breast, left; Depression; Supervision of other high risk pregnancies, first trimester; History of LEEP (loop electrosurgical excision procedure) of cervix complicating pregnancy in first trimester; AMA (advanced maternal age) multigravida 35+; Dichorionic diamniotic twin pregnancy in first trimester; Rh negative status during pregnancy in first trimester, antepartum; and Asymptomatic bacteriuria during pregnancy in first trimester on her problem list.  Patient reports fatigue and pedal edema.  Contractions: Not present.  .  Movement: Absent. Denies leaking of fluid.   The following portions of the patient's history were reviewed and updated as appropriate: allergies, current medications, past family history, past medical history, past social history, past surgical history and problem list. Problem list updated.  Objective:   Filed Vitals:   02/29/16 1425  BP: 140/81  Pulse: 89  Weight: 293 lb (132.904 kg)    Fetal Status: Fetal Heart Rate (bpm): NST-R   Movement: Absent     General:  Alert, oriented and cooperative. Patient is in no acute distress.  Skin: Skin is warm and dry. No rash noted.   Cardiovascular: Normal heart rate noted  Respiratory: Normal respiratory effort, no problems with respiration noted  Abdomen: Soft, gravid, appropriate for gestational age. Pain/Pressure: Present     Pelvic:       Cervical exam deferred        Extremities: Normal range of motion.  Edema: Moderate pitting, indentation subsides rapidly  Mental Status: Normal mood and affect. Normal behavior. Normal  judgment and thought content.   Urinalysis: Urine Protein: 1+ Urine Glucose: Negative  Assessment and Plan:  Pregnancy: G3P1011 at 394w6d  1. Supervision of other high risk pregnancies, first trimester -Lots of stress at work. rec modified bedrest this week Check 24 hour urine and labs, start tomorrow  2. Rh negative status during pregnancy in first trimester, antepartum, fetus 2   3. Obesity   4. History of LEEP (loop electrosurgical excision procedure) of cervix complicating pregnancy in first trimester   5. Dichorionic diamniotic twin pregnancy in first trimester   6. Depression   7. AMA (advanced maternal age) multigravida 35+, first trimester   Preterm labor symptoms and general obstetric precautions including but not limited to vaginal bleeding, contractions, leaking of fluid and fetal movement were reviewed in detail with the patient. Please refer to After Visit Summary for other counseling recommendations.  Return in about 2 weeks (around 03/14/2016).   Allie BossierMyra C Savva Beamer, MD

## 2016-02-29 NOTE — Addendum Note (Signed)
Addended by: Granville LewisLARK, Ireoluwa Gorsline L on: 02/29/2016 03:26 PM   Modules accepted: Orders

## 2016-03-02 ENCOUNTER — Encounter: Payer: Self-pay | Admitting: *Deleted

## 2016-03-03 ENCOUNTER — Ambulatory Visit (INDEPENDENT_AMBULATORY_CARE_PROVIDER_SITE_OTHER): Payer: BLUE CROSS/BLUE SHIELD | Admitting: Obstetrics & Gynecology

## 2016-03-03 VITALS — BP 137/78 | HR 88 | Wt 293.0 lb

## 2016-03-03 DIAGNOSIS — Z3483 Encounter for supervision of other normal pregnancy, third trimester: Secondary | ICD-10-CM

## 2016-03-03 DIAGNOSIS — O133 Gestational [pregnancy-induced] hypertension without significant proteinuria, third trimester: Secondary | ICD-10-CM

## 2016-03-03 DIAGNOSIS — O30043 Twin pregnancy, dichorionic/diamniotic, third trimester: Secondary | ICD-10-CM | POA: Diagnosis not present

## 2016-03-03 DIAGNOSIS — O163 Unspecified maternal hypertension, third trimester: Secondary | ICD-10-CM

## 2016-03-03 LAB — COMPREHENSIVE METABOLIC PANEL
ALBUMIN: 2.8 g/dL — AB (ref 3.6–5.1)
ALT: 10 U/L (ref 6–29)
AST: 14 U/L (ref 10–30)
Alkaline Phosphatase: 197 U/L — ABNORMAL HIGH (ref 33–115)
BUN: 4 mg/dL — ABNORMAL LOW (ref 7–25)
CHLORIDE: 106 mmol/L (ref 98–110)
CO2: 22 mmol/L (ref 20–31)
Calcium: 8.2 mg/dL — ABNORMAL LOW (ref 8.6–10.2)
Creat: 0.51 mg/dL (ref 0.50–1.10)
Glucose, Bld: 78 mg/dL (ref 65–99)
POTASSIUM: 3.7 mmol/L (ref 3.5–5.3)
Sodium: 139 mmol/L (ref 135–146)
Total Bilirubin: 0.3 mg/dL (ref 0.2–1.2)
Total Protein: 5.4 g/dL — ABNORMAL LOW (ref 6.1–8.1)

## 2016-03-03 LAB — CBC
HCT: 30.8 % — ABNORMAL LOW (ref 35.0–45.0)
HEMOGLOBIN: 10.1 g/dL — AB (ref 11.7–15.5)
MCH: 30.1 pg (ref 27.0–33.0)
MCHC: 32.8 g/dL (ref 32.0–36.0)
MCV: 91.9 fL (ref 80.0–100.0)
MPV: 9.7 fL (ref 7.5–12.5)
PLATELETS: 300 10*3/uL (ref 140–400)
RBC: 3.35 MIL/uL — AB (ref 3.80–5.10)
RDW: 16.4 % — ABNORMAL HIGH (ref 11.0–15.0)
WBC: 8.5 10*3/uL (ref 3.8–10.8)

## 2016-03-03 NOTE — Progress Notes (Signed)
Subjective:  Katherine [name] is a 35 y.o. G3P1011 at 7079w2d being seen today for ongoing prenatal care.  She is currently monitored for the following issues for this high-risk pregnancy and has History of gallstones; Alternating constipation and diarrhea; Abdominal pain, chronic, epigastric; GERD (gastroesophageal reflux disease); Obesity; Elevated BP; Concentration deficit; Family history of DVT; Mass of breast, left; Depression; Supervision of other high risk pregnancies, first trimester; History of LEEP (loop electrosurgical excision procedure) of cervix complicating pregnancy in first trimester; AMA (advanced maternal age) multigravida 35+; Dichorionic diamniotic twin pregnancy in first trimester; Rh negative status during pregnancy in first trimester, antepartum; and Asymptomatic bacteriuria during pregnancy in first trimester on her problem list.  Patient reports sellin of feet, hands and lips.  No HA, scotomata, RUQ pain.  Contractions: Not present. Vag. Bleeding: None.  Movement: Present. Denies leaking of fluid.   The following portions of the patient's history were reviewed and updated as appropriate: allergies, current medications, past family history, past medical history, past social history, past surgical history and problem list. Problem list updated.  Objective:   Filed Vitals:   03/03/16 1329  BP: 137/78  Pulse: 88  Weight: 293 lb (132.904 kg)    Fetal Status:     Movement: Present     General:  Alert, oriented and cooperative. Patient is in no acute distress.  Skin: Skin is warm and dry. No rash noted.   Cardiovascular: Normal heart rate noted  Respiratory: Normal respiratory effort, no problems with respiration noted  Abdomen: Soft, gravid, appropriate for gestational age. Pain/Pressure: Present     Pelvic: Vag. Bleeding: None Vag D/C Character: Thin   Cervical exam deferred        Extremities: Normal range of motion.  Edema: Moderate pitting, indentation subsides  rapidly  Mental Status: Normal mood and affect. Normal behavior. Normal judgment and thought content.   Urinalysis: Urine Protein: 1+ Urine Glucose: Negative  Assessment and Plan:  Pregnancy: G3P1011 at 5379w2d  1. Elevated blood pressure affecting pregnancy in third trimester, antepartum -One elevated pressure; 24 hour urine pending; PIH labs nml -If one more increased pressure or 300 mg proteinuria, will give steroids and check labs weekly - Fetal nonstress test; Future  2.  [name] -Concordant growth -Has US follow scheduled with MFM  Preterm labor symptoms and general obstetric precautions including but not limited to vaginal bleeding, contractions, leaking of fluid and fetal movement were reviewed in detail with the patient. Please refer to After Visit Summary for other counseling recommendations.  No Follow-up on file.   Lesly DukesKelly H Rainey Rodger, MD

## 2016-03-04 LAB — CREATININE CLEARANCE, URINE, 24 HOUR
CREATININE, URINE: 95 mg/dL (ref 20–320)
CREATININE: 0.51 mg/dL (ref 0.50–1.10)
Creatinine Clearance: 246 mL/min — ABNORMAL HIGH (ref 75–115)
Creatinine, 24H Ur: 1.81 g/(24.h) (ref 0.63–2.50)

## 2016-03-04 LAB — PROTEIN, URINE, 24 HOUR
PROTEIN, URINE: 40 mg/dL — AB (ref 5–24)
Protein, 24H Urine: 760 mg/24 h — ABNORMAL HIGH (ref <150)

## 2016-03-07 ENCOUNTER — Ambulatory Visit (INDEPENDENT_AMBULATORY_CARE_PROVIDER_SITE_OTHER): Payer: BLUE CROSS/BLUE SHIELD | Admitting: *Deleted

## 2016-03-07 ENCOUNTER — Inpatient Hospital Stay (HOSPITAL_COMMUNITY)
Admission: AD | Admit: 2016-03-07 | Discharge: 2016-03-10 | DRG: 781 | Disposition: A | Discharge status: Home / Self Care | Payer: BLUE CROSS/BLUE SHIELD | Source: Ambulatory Visit | Attending: Family Medicine | Admitting: Family Medicine

## 2016-03-07 ENCOUNTER — Encounter (HOSPITAL_COMMUNITY): Payer: Self-pay

## 2016-03-07 ENCOUNTER — Ambulatory Visit: Payer: BLUE CROSS/BLUE SHIELD | Admitting: Obstetrics & Gynecology

## 2016-03-07 VITALS — BP 152/80 | HR 87 | Wt 300.0 lb

## 2016-03-07 DIAGNOSIS — O1403 Mild to moderate pre-eclampsia, third trimester: Secondary | ICD-10-CM | POA: Diagnosis present

## 2016-03-07 DIAGNOSIS — Z6791 Unspecified blood type, Rh negative: Secondary | ICD-10-CM

## 2016-03-07 DIAGNOSIS — O1490 Unspecified pre-eclampsia, unspecified trimester: Secondary | ICD-10-CM | POA: Diagnosis present

## 2016-03-07 DIAGNOSIS — O99613 Diseases of the digestive system complicating pregnancy, third trimester: Secondary | ICD-10-CM | POA: Diagnosis present

## 2016-03-07 DIAGNOSIS — O133 Gestational [pregnancy-induced] hypertension without significant proteinuria, third trimester: Secondary | ICD-10-CM

## 2016-03-07 DIAGNOSIS — Z87891 Personal history of nicotine dependence: Secondary | ICD-10-CM | POA: Diagnosis not present

## 2016-03-07 DIAGNOSIS — O09523 Supervision of elderly multigravida, third trimester: Secondary | ICD-10-CM | POA: Diagnosis not present

## 2016-03-07 DIAGNOSIS — O30043 Twin pregnancy, dichorionic/diamniotic, third trimester: Secondary | ICD-10-CM | POA: Diagnosis present

## 2016-03-07 DIAGNOSIS — O1400 Mild to moderate pre-eclampsia, unspecified trimester: Secondary | ICD-10-CM | POA: Diagnosis present

## 2016-03-07 DIAGNOSIS — O3441 Maternal care for other abnormalities of cervix, first trimester: Secondary | ICD-10-CM

## 2016-03-07 DIAGNOSIS — O360112 Maternal care for anti-D [Rh] antibodies, first trimester, fetus 2: Secondary | ICD-10-CM

## 2016-03-07 DIAGNOSIS — Z3A32 32 weeks gestation of pregnancy: Secondary | ICD-10-CM

## 2016-03-07 DIAGNOSIS — O30041 Twin pregnancy, dichorionic/diamniotic, first trimester: Secondary | ICD-10-CM

## 2016-03-07 DIAGNOSIS — O26893 Other specified pregnancy related conditions, third trimester: Secondary | ICD-10-CM | POA: Diagnosis present

## 2016-03-07 DIAGNOSIS — Z833 Family history of diabetes mellitus: Secondary | ICD-10-CM

## 2016-03-07 DIAGNOSIS — Z8249 Family history of ischemic heart disease and other diseases of the circulatory system: Secondary | ICD-10-CM | POA: Diagnosis not present

## 2016-03-07 DIAGNOSIS — O09891 Supervision of other high risk pregnancies, first trimester: Secondary | ICD-10-CM

## 2016-03-07 DIAGNOSIS — Z801 Family history of malignant neoplasm of trachea, bronchus and lung: Secondary | ICD-10-CM | POA: Diagnosis not present

## 2016-03-07 DIAGNOSIS — K219 Gastro-esophageal reflux disease without esophagitis: Secondary | ICD-10-CM | POA: Diagnosis present

## 2016-03-07 DIAGNOSIS — O09521 Supervision of elderly multigravida, first trimester: Secondary | ICD-10-CM

## 2016-03-07 DIAGNOSIS — O09529 Supervision of elderly multigravida, unspecified trimester: Secondary | ICD-10-CM

## 2016-03-07 DIAGNOSIS — O26891 Other specified pregnancy related conditions, first trimester: Secondary | ICD-10-CM | POA: Diagnosis present

## 2016-03-07 DIAGNOSIS — Z9889 Other specified postprocedural states: Secondary | ICD-10-CM

## 2016-03-07 DIAGNOSIS — IMO0001 Reserved for inherently not codable concepts without codable children: Secondary | ICD-10-CM

## 2016-03-07 DIAGNOSIS — Z3A33 33 weeks gestation of pregnancy: Secondary | ICD-10-CM | POA: Diagnosis not present

## 2016-03-07 DIAGNOSIS — Z823 Family history of stroke: Secondary | ICD-10-CM

## 2016-03-07 DIAGNOSIS — Z818 Family history of other mental and behavioral disorders: Secondary | ICD-10-CM

## 2016-03-07 DIAGNOSIS — O321XX2 Maternal care for breech presentation, fetus 2: Secondary | ICD-10-CM | POA: Diagnosis present

## 2016-03-07 DIAGNOSIS — O1493 Unspecified pre-eclampsia, third trimester: Secondary | ICD-10-CM | POA: Diagnosis present

## 2016-03-07 DIAGNOSIS — R030 Elevated blood-pressure reading, without diagnosis of hypertension: Principal | ICD-10-CM

## 2016-03-07 LAB — CBC
HEMATOCRIT: 31.6 % — AB (ref 36.0–46.0)
HEMOGLOBIN: 10.2 g/dL — AB (ref 12.0–15.0)
MCH: 30.2 pg (ref 26.0–34.0)
MCHC: 32.3 g/dL (ref 30.0–36.0)
MCV: 93.5 fL (ref 78.0–100.0)
Platelets: 290 10*3/uL (ref 150–400)
RBC: 3.38 MIL/uL — AB (ref 3.87–5.11)
RDW: 16.4 % — ABNORMAL HIGH (ref 11.5–15.5)
WBC: 11 10*3/uL — AB (ref 4.0–10.5)

## 2016-03-07 LAB — COMPREHENSIVE METABOLIC PANEL
ALBUMIN: 2.7 g/dL — AB (ref 3.5–5.0)
ALK PHOS: 211 U/L — AB (ref 38–126)
ALT: 15 U/L (ref 14–54)
ANION GAP: 8 (ref 5–15)
AST: 24 U/L (ref 15–41)
BILIRUBIN TOTAL: 0.4 mg/dL (ref 0.3–1.2)
BUN: 6 mg/dL (ref 6–20)
CALCIUM: 8.7 mg/dL — AB (ref 8.9–10.3)
CO2: 21 mmol/L — AB (ref 22–32)
Chloride: 108 mmol/L (ref 101–111)
Creatinine, Ser: 0.59 mg/dL (ref 0.44–1.00)
GFR calc Af Amer: >60 mL/min (ref >60)
GFR calc non Af Amer: >60 mL/min (ref >60)
GLUCOSE: 138 mg/dL — AB (ref 65–99)
Potassium: 3.5 mmol/L (ref 3.5–5.1)
SODIUM: 137 mmol/L (ref 135–145)
Total Protein: 6.4 g/dL — ABNORMAL LOW (ref 6.5–8.1)

## 2016-03-07 LAB — TYPE AND SCREEN
ABO/RH(D): O NEG
ANTIBODY SCREEN: NEGATIVE

## 2016-03-07 MED ORDER — CALCIUM CARBONATE ANTACID 500 MG PO CHEW: 2.0000 | CHEWABLE_TABLET | ORAL | Status: DC | PRN

## 2016-03-07 MED ORDER — DOCUSATE SODIUM 100 MG PO CAPS
100.0000 mg | ORAL_CAPSULE | Freq: Every day | ORAL | Status: DC
Start: 2016-03-07 — End: 2016-03-10
  Administered 2016-03-08 – 2016-03-10 (×3): 100 mg via ORAL
  Filled 2016-03-07 (×4): qty 1

## 2016-03-07 MED ORDER — BETAMETHASONE SOD PHOS & ACET 6 (3-3) MG/ML IJ SUSP
12.0000 mg | Freq: Once | INTRAMUSCULAR | Status: AC
  Administered 2016-03-08: 12 mg via INTRAMUSCULAR
  Filled 2016-03-07: qty 2

## 2016-03-07 MED ORDER — ZOLPIDEM TARTRATE 5 MG PO TABS: 5.0000 mg | ORAL_TABLET | Freq: Every evening | ORAL | Status: DC | PRN

## 2016-03-07 MED ORDER — LABETALOL HCL 5 MG/ML IV SOLN: 20.0000 mg | INTRAVENOUS | Status: DC | PRN

## 2016-03-07 MED ORDER — PRENATAL MULTIVITAMIN CH
1.0000 | ORAL_TABLET | Freq: Every day | ORAL | Status: DC
  Administered 2016-03-08 – 2016-03-10 (×3): 1 via ORAL
  Filled 2016-03-07 (×3): qty 1

## 2016-03-07 MED ORDER — ACETAMINOPHEN 325 MG PO TABS
650.0000 mg | ORAL_TABLET | ORAL | Status: DC | PRN
  Administered 2016-03-10: 650 mg via ORAL
  Filled 2016-03-07: qty 2

## 2016-03-07 MED ORDER — HYDRALAZINE HCL 20 MG/ML IJ SOLN: 10.0000 mg | Freq: Once | INTRAMUSCULAR | Status: DC | PRN

## 2016-03-07 MED ORDER — BETAMETHASONE SOD PHOS & ACET 6 (3-3) MG/ML IJ SUSP
12.0000 mg | Freq: Once | INTRAMUSCULAR | Status: AC
  Administered 2016-03-07: 12 mg via INTRAMUSCULAR

## 2016-03-07 NOTE — H&P (Signed)
ANTEPARTUM ADMISSION HISTORY AND PHYSICAL NOTE   History of Present Illness: Katherine [name] is a 35 y.o. G3P1011 at [redacted]w[redacted]d admitted for pre-eclampsia with worsening BPs. Patient reports the fetal movement as decreased . Patient reports uterine contraction  activity as none. Patient reports  vaginal bleeding as none. Patient describes fluid per vagina as None. Fetal presentation is cephalic for baby A and breech for baby B.  Pt having di-di twins. Most recent US (4/21) showing baby A is 1732g and cephalic and baby B is 1733g and breech.  Pt endorses headache for the last 3 days. She denies any blurred vision or RUQ/epigastric pain.  Patient Active Problem List   Diagnosis Date Noted  . Pre-eclampsia 03/07/2016  . Dichorionic diamniotic twin pregnancy in first trimester 10/12/2015  . Rh negative status during pregnancy in first trimester, antepartum 10/12/2015  . Asymptomatic bacteriuria during pregnancy in first trimester 10/12/2015  . History of LEEP (loop electrosurgical excision procedure) of cervix complicating pregnancy in first trimester 09/25/2015  . AMA (advanced maternal age) multigravida 35+ 09/25/2015  . Supervision of other high risk pregnancies, first trimester 09/14/2015  . Depression 01/01/2015  . Mass of breast, left 08/30/2013  . Family history of DVT 08/20/2013  . Concentration deficit 08/12/2013  . GERD (gastroesophageal reflux disease) 01/22/2013  . Obesity 01/22/2013  . Elevated BP 01/22/2013  . History of gallstones 01/08/2013  . Alternating constipation and diarrhea 01/08/2013  . Abdominal pain, chronic, epigastric 01/08/2013    Past Medical History  Diagnosis Date  . Gall stones   . History of gallstones 01/08/2013  . GERD (gastroesophageal reflux disease) 01/22/2013  . Alternating constipation and diarrhea 01/08/2013  . Abnormal Pap smear 2008    Leep  . History of depression     Past Surgical History  Procedure Laterality Date  . Wisdom  tooth extraction      OB History  Gravida Para Term Preterm AB SAB TAB Ectopic Multiple Living  3 1 1  1  1   1     # Outcome Date GA Lbr Len/2nd Weight Sex Delivery Anes PTL Lv  3 Current           2 Term 11/18/00 [redacted]w[redacted]d   F Vag-Spont   Y  1 TAB               Social History   Social History  . Marital Status: Married    Spouse Name: N/A  . Number of Children: N/A  . Years of Education: N/A   Occupational History  . loan analyst    Social History Main Topics  . Smoking status: Former Smoker -- 1.50 packs/day for 15 years    Types: Cigarettes  . Smokeless tobacco: Not on file  . Alcohol Use: No  . Drug Use: No  . Sexual Activity:    Partners: Male   Other Topics Concern  . Not on file   Social History Narrative    Family History  Problem Relation Age of Onset  . Migraines Mother   . Diverticulitis Mother   . Depression Mother   . Hyperlipidemia Mother   . Hypertension Mother   . Alcoholism      grandparent  . Lung cancer      grandparent  . Diabetes      aunt  . Stroke      greatgrandmother  . Diabetes Maternal Grandmother     Allergies  Allergen Reactions  . Macrobid [Nitrofurantoin] Nausea And Vomiting  Hyperemesis     Prescriptions prior to admission  Medication Sig Dispense Refill Last Dose  . cyclobenzaprine (FLEXERIL) 10 MG tablet Take 1 tablet (10 mg total) by mouth 3 (three) times daily as needed for muscle spasms. 30 tablet 0 Taking  . dextromethorphan-guaiFENesin (MUCINEX DM) 30-600 MG 12hr tablet Take 1 tablet by mouth 2 (two) times daily. 30 tablet 0 Taking  . Elastic Bandages & Supports (MEDICAL COMPRESSION STOCKINGS) MISC 1 Device by Does not apply route daily. 1 each 1 Taking  . Fe Fum-FePoly-FA-Vit C-Vit B3 (INTEGRA F) 125-1 MG CAPS Take 1 capsule by mouth daily. 30 capsule 6 Taking  . pantoprazole (PROTONIX) 40 MG tablet Take 1 tablet (40 mg total) by mouth daily. 30 tablet 0 Taking  . Prenat w/o A Vit-FeFum-FePo-FA (CONCEPT OB)  130-92.4-1 MG CAPS Take 1 tablet by mouth daily. 30 capsule 12 Taking  . pseudoephedrine (SUDAFED) 30 MG tablet Take 1 tablet (30 mg total) by mouth every 8 (eight) hours as needed for congestion. 30 tablet 0 Taking  . triamcinolone ointment (KENALOG) 0.1 % Apply 1 application topically 2 (two) times daily. 30 g 0 Taking    Review of Systems - General ROS: negative for - chills or fever Ophthalmic ROS: negative for - blurry vision or scotomata ENT ROS: positive for - headaches negative for - visual changes Hematological and Lymphatic ROS: negative for - bruising or jaundice Endocrine ROS: negative for - palpitations or skin changes Respiratory ROS: no cough, shortness of breath, or wheezing Cardiovascular ROS: no chest pain or dyspnea on exertion Gastrointestinal ROS: no abdominal pain, change in bowel habits, or black or bloody stools Genito-Urinary ROS: no dysuria, trouble voiding, or hematuria Musculoskeletal ROS: positive for- back pain Dermatological ROS: negative for- rash  Vitals:  LMP 07/21/2015 Physical Examination: CONSTITUTIONAL: Well-developed, well-nourished female in no acute distress.  HENT:  Normocephalic, atraumatic. EYES: Conjunctivae and EOM are normal. No scleral icterus.  NECK: Normal range of motion, supple. SKIN: Skin is warm and dry. No rash noted. Not diaphoretic. No erythema. No pallor. NEUROLGIC: Alert and oriented to person, place, and time. Normal reflexes, muscle tone coordination. No cranial nerve deficit noted. PSYCHIATRIC: Normal mood and affect. Normal behavior. Normal judgment and thought content. CARDIOVASCULAR: Normal heart rate noted, regular rhythm, IV/VI systolic murmur heard loudest in the left sternal border RESPIRATORY: Effort and breath sounds normal, no crackles ABDOMEN: Soft, nontender, nondistended, gravid. MUSCULOSKELETAL: Normal range of motion. No edema and no tenderness. 2+ distal pulses.  Fetal Monitoring:  Baby A: 150bpm, moderate  variability, accelerations present, no decelerations  Baby B: 150bpm, moderate variability, accelerations present, no decelerations  Labs:  No results found for this or any previous visit (from the past 24 hour(s)).  Imaging Studies: [place] Mfm Ob Follow Up  02/19/2016  OBSTETRICAL ULTRASOUND: This exam was performed within a [facility] Department. The OB US report was generated in the AS system, and faxed to the ordering physician.  This report is available in the [employer]. See the AS Obstetric US report via the Image Link.  [place] Mfm Ob Follow Up Addl Gest  02/19/2016  OBSTETRICAL ULTRASOUND: This exam was performed within a  Ultrasound Department. The OB US report was generated in the AS system, and faxed to the ordering physician.  This report is available in the [employer] [employer]. See the AS Obstetric US report via the Image Link.    Assessment and Plan: Patient Active Problem List   Diagnosis Date Noted  .  Pre-eclampsia 03/07/2016  . Dichorionic diamniotic twin pregnancy in first trimester 10/12/2015  . Rh negative status during pregnancy in first trimester, antepartum 10/12/2015  . Asymptomatic bacteriuria during pregnancy in first trimester 10/12/2015  . History of LEEP (loop electrosurgical excision procedure) of cervix complicating pregnancy in first trimester 09/25/2015  . AMA (advanced maternal age) multigravida 35+ 09/25/2015  . Supervision of other high risk pregnancies, first trimester 09/14/2015  . Depression 01/01/2015  . Mass of breast, left 08/30/2013  . Family history of DVT 08/20/2013  . Concentration deficit 08/12/2013  . GERD (gastroesophageal reflux disease) 01/22/2013  . Obesity 01/22/2013  . Elevated BP 01/22/2013  . History of gallstones 01/08/2013  . Alternating constipation and diarrhea 01/08/2013  . Abdominal pain, chronic, epigastric 01/08/2013   Admit to Antenatal Routine antenatal care CBC, CMP Received BMZ today. Plan for another  dose tomorrow. Labetalol protocol  Hilton SinclairKaty D Mayo, MD

## 2016-03-07 NOTE — Progress Notes (Signed)
Direct admit to antenatal

## 2016-03-08 ENCOUNTER — Ambulatory Visit: Payer: BLUE CROSS/BLUE SHIELD

## 2016-03-08 DIAGNOSIS — O30043 Twin pregnancy, dichorionic/diamniotic, third trimester: Secondary | ICD-10-CM

## 2016-03-08 DIAGNOSIS — Z3A33 33 weeks gestation of pregnancy: Secondary | ICD-10-CM

## 2016-03-08 DIAGNOSIS — O1493 Unspecified pre-eclampsia, third trimester: Secondary | ICD-10-CM

## 2016-03-08 DIAGNOSIS — O1400 Mild to moderate pre-eclampsia, unspecified trimester: Secondary | ICD-10-CM | POA: Diagnosis present

## 2016-03-08 LAB — ABO/RH: ABO/RH(D): O NEG

## 2016-03-08 MED ORDER — CYCLOBENZAPRINE HCL 10 MG PO TABS
10.0000 mg | ORAL_TABLET | Freq: Three times a day (TID) | ORAL | Status: DC | PRN
  Administered 2016-03-08 (×2): 10 mg via ORAL
  Filled 2016-03-08 (×2): qty 1

## 2016-03-08 NOTE — Progress Notes (Signed)
Patient ID: Katherine [name], female   DOB: 09-Nov-1980, 35 y.o.   MRN: [id number] FACULTY PRACTICE ANTEPARTUM NOTE  Katherine [name] is a 35 y.o. G3P1011 at [redacted]w[redacted]d  who is admitted for preeclampsia.   Fetal presentation is cephalic/breech. Length of Stay:  1  Days  Subjective: Patient blurred vision, abdominal pain, nausea.  Does have headache x 4 days - "dull roar".  Intensity fluctuates - worse as day progresses.  Patient reports good fetal movement.   She reports no uterine contractions She reports no bleeding  She reports no loss of fluid per vagina.  Vitals:  Blood pressure 148/80, pulse 82, temperature 98.2 F (36.8 C), temperature source Oral, resp. rate 18, height  (1.778 m), weight 300 lb (136.079 kg), last menstrual period 07/21/2015. Physical Examination:  General appearance - alert, well appearing, and in no distress Mental status - alert, oriented to person, place, and time Chest - clear to auscultation, no wheezes, rales or rhonchi, symmetric air entry Heart - normal rate, regular rhythm, normal S1, S2, no murmurs, rubs, clicks or gallops Abdomen - soft, nontender, nondistended, no masses or organomegaly Fundal Height:  consistent with twins Extremities: extremities normal, atraumatic, no cyanosis or edema with DTRs 3+ on the left and 2+ on the right Membranes:intact  Fetal Monitoring:  Baseline: 145/140 bpm, Variability: Good {> 6 bpm), Accelerations: Reactive and Decelerations: Absent  Labs:  Results for orders placed or performed during the hospital encounter of 03/07/16 (from the past 24 hour(s))  ABO/Rh   Collection Time: 03/07/16  7:25 PM  Result Value Ref Range   ABO/RH(D) O NEG   Type and screen [facility] [place]   Collection Time: 03/07/16  7:26 PM  Result Value Ref Range   ABO/RH(D) O NEG    Antibody Screen NEG    Sample Expiration 03/10/2016   CBC   Collection Time: 03/07/16  7:56 PM  Result Value Ref Range   WBC 11.0 (H)  4.0 - 10.5 K/uL   RBC 3.38 (L) 3.87 - 5.11 MIL/uL   Hemoglobin 10.2 (L) 12.0 - 15.0 g/dL   HCT 40.9 (L) 81.1 - 91.4 %   MCV 93.5 78.0 - 100.0 fL   MCH 30.2 26.0 - 34.0 pg   MCHC 32.3 30.0 - 36.0 g/dL   RDW 78.2 (H) 95.6 - 21.3 %   Platelets 290 150 - 400 K/uL  Comprehensive metabolic panel   Collection Time: 03/07/16  7:56 PM  Result Value Ref Range   Sodium 137 135 - 145 mmol/L   Potassium 3.5 3.5 - 5.1 mmol/L   Chloride 108 101 - 111 mmol/L   CO2 21 (L) 22 - 32 mmol/L   Glucose, Bld 138 (H) 65 - 99 mg/dL   BUN 6 6 - 20 mg/dL   Creatinine, Ser 0.86 0.44 - 1.00 mg/dL   Calcium 8.7 (L) 8.9 - 10.3 mg/dL   Total Protein 6.4 (L) 6.5 - 8.1 g/dL   Albumin 2.7 (L) 3.5 - 5.0 g/dL   AST 24 15 - 41 U/L   ALT 15 14 - 54 U/L   Alkaline Phosphatase 211 (H) 38 - 126 U/L   Total Bilirubin 0.4 0.3 - 1.2 mg/dL   GFR calc non Af Amer >60 >60 mL/min   GFR calc Af Amer >60 >60 mL/min   Anion gap 8 5 - 15    Imaging Studies:    Vertex/breech, concordant growth.  [place] today   Medications:  Scheduled . betamethasone acetate-betamethasone  sodium phosphate  12 mg Intramuscular Once  . docusate sodium  100 mg Oral Daily  . prenatal multivitamin  1 tablet Oral Q1200   I have reviewed the patient's current medications.  ASSESSMENT: Patient Active Problem List   Diagnosis Date Noted  . Pre-eclampsia 03/07/2016  . Dichorionic diamniotic twin pregnancy in first trimester 10/12/2015  . Rh negative status during pregnancy in first trimester, antepartum 10/12/2015  . Asymptomatic bacteriuria during pregnancy in first trimester 10/12/2015  . History of LEEP (loop electrosurgical excision procedure) of cervix complicating pregnancy in first trimester 09/25/2015  . AMA (advanced maternal age) multigravida 35+ 09/25/2015  . Supervision of other high risk pregnancies, first trimester 09/14/2015  . Depression 01/01/2015  . Mass of breast, left 08/30/2013  . Family history of DVT 08/20/2013  .  Concentration deficit 08/12/2013  . GERD (gastroesophageal reflux disease) 01/22/2013  . Obesity 01/22/2013  . Elevated BP 01/22/2013  . History of gallstones 01/08/2013  . Alternating constipation and diarrhea 01/08/2013  . Abdominal pain, chronic, epigastric 01/08/2013    PLAN: 1.  Preeclampsia 2.  Di/di twins  2nd dose of betamethasone at 3pm  BPs still in mild range - will see how her BP increases over course of day.  US today for growth.  Flexeril for headache Continue routine antenatal care.   Levie HeritageJacob J Esteban Kobashigawa, DO 03/08/2016,7:06 AM

## 2016-03-09 MED ORDER — LABETALOL HCL 100 MG PO TABS: 200.0000 mg | ORAL_TABLET | Freq: Two times a day (BID) | ORAL | Status: DC

## 2016-03-09 MED ORDER — LABETALOL HCL 100 MG PO TABS
200.0000 mg | ORAL_TABLET | Freq: Three times a day (TID) | ORAL | Status: DC
  Administered 2016-03-09 – 2016-03-10 (×4): 200 mg via ORAL
  Filled 2016-03-09 (×5): qty 2

## 2016-03-09 NOTE — Progress Notes (Signed)
Patient ID: Katherine [name], female   DOB: 03-07-1981, 35 y.o.   MRN: [id number] FACULTY PRACTICE ANTEPARTUM NOTE  Katherine SheenMartha L [name] is a 35 y.o. G3P1011 at 7081w1d  who is admitted for preeclampsia.   Fetal presentation is cephalic/breech. Length of Stay:  2  Days  Subjective: Patient denies blurred vision, abdominal pain, nausea.  Does have very mild intermittent headache. Patient reports good fetal movement.   She reports no uterine contractions She reports no bleeding  She reports no loss of fluid per vagina.  Vitals:  Blood pressure 128/74, pulse 80, temperature 98.4 F (36.9 C), temperature source Oral, resp. rate 18, height 5\' 10"  (1.778 m), weight 300 lb (136.079 kg), last menstrual period 07/21/2015.  Blood pressure 128/74, pulse 80, temperature 98.4 F (36.9 C), temperature source Oral, resp. rate 18, height 5\' 10"  (1.778 m), weight 300 lb (136.079 kg), last menstrual period 07/21/2015.  Filed Vitals:   03/08/16 1935 03/08/16 2200 03/08/16 2343 03/09/16 0624  BP: 161/75 154/76 154/76 128/74  Pulse: 84 93 88 80  Temp: 97.5 F (36.4 C)  98.1 F (36.7 C) 98.4 F (36.9 C)  TempSrc: Oral  Oral Oral  Resp: 18 18 18 18   Height:      Weight:        Physical Examination: General appearance - alert, well appearing, and in no distress Mental status - alert, oriented to person, place, and time Chest - clear to auscultation, no wheezes, rales or rhonchi, symmetric air entry Heart - normal rate, regular rhythm, normal S1, S2, no murmurs, rubs, clicks or gallops Abdomen - soft, nontender, nondistended, no masses or organomegaly Fundal Height:  consistent with twins Extremities: extremities normal, atraumatic, no cyanosis or edema with DTRs 3+ on the left and 2+ on the right Membranes:intact  Fetal Monitoring:  Baseline: 145/140 bpm, Variability: Good {> 6 bpm), Accelerations: Reactive and Decelerations: Absent  Labs:  No results found for this or any previous visit (from  the past 24 hour(s)).  Imaging Studies:    Vertex/breech, concordant growth.  US today   Medications:  Scheduled . docusate sodium  100 mg Oral Daily  . labetalol  200 mg Oral TID  . prenatal multivitamin  1 tablet Oral Q1200   I have reviewed the patient's current medications.  ASSESSMENT: Patient Active Problem List   Diagnosis Date Noted  . Mild preeclampsia   . Pre-eclampsia 03/07/2016  . Dichorionic diamniotic twin pregnancy in first trimester 10/12/2015  . Rh negative status during pregnancy in first trimester, antepartum 10/12/2015  . Asymptomatic bacteriuria during pregnancy in first trimester 10/12/2015  . History of LEEP (loop electrosurgical excision procedure) of cervix complicating pregnancy in first trimester 09/25/2015  . AMA (advanced maternal age) multigravida 35+ 09/25/2015  . Supervision of other high risk pregnancies, first trimester 09/14/2015  . Depression 01/01/2015  . Mass of breast, left 08/30/2013  . Family history of DVT 08/20/2013  . Concentration deficit 08/12/2013  . GERD (gastroesophageal reflux disease) 01/22/2013  . Obesity 01/22/2013  . Elevated BP 01/22/2013  . History of gallstones 01/08/2013  . Alternating constipation and diarrhea 01/08/2013  . Abdominal pain, chronic, epigastric 01/08/2013    PLAN: 1.  Preeclampsia  Had elevated SBP in150s yesterday, one 161.  Also had isolated 160 on 5/8. Currently 128/74.. Will start Labetalol 200 mg po tid to see if this will help manage her BP.  If BP continues to be severe range or other severe features occur (worsening headache etc), may need to  proceed with delivery.  2.  Di/di twins  s/p betamethasone regimen  [place] today for growth next week if still pregnant Continue routine antenatal care.   Tereso Newcomer, MD 03/09/2016,7:48 AM

## 2016-03-10 MED ORDER — LABETALOL HCL 200 MG PO TABS
200.0000 mg | ORAL_TABLET | Freq: Three times a day (TID) | ORAL | Status: DC
Start: 2016-03-10 — End: 2016-03-17

## 2016-03-10 NOTE — Discharge Instructions (Signed)
Fetal Movement Counts  Patient Name: __________________________________________________ Patient Due Date: ____________________  Performing a fetal movement count is highly recommended in high-risk pregnancies, but it is good for every pregnant woman to do. Your health care provider may ask you to start counting fetal movements at 28 weeks of the pregnancy. Fetal movements often increase:  · After eating a full meal.  · After physical activity.  · After eating or drinking something sweet or cold.  · At rest.  Pay attention to when you feel the baby is most active. This will help you notice a pattern of your baby's sleep and wake cycles and what factors contribute to an increase in fetal movement. It is important to perform a fetal movement count at the same time each day when your baby is normally most active.   HOW TO COUNT FETAL MOVEMENTS  1. Find a quiet and comfortable area to sit or lie down on your left side. Lying on your left side provides the best blood and oxygen circulation to your baby.  2. Write down the day and time on a sheet of paper or in a journal.  3. Start counting kicks, flutters, swishes, rolls, or jabs in a 2-hour period. You should feel at least 10 movements within 2 hours.  4. If you do not feel 10 movements in 2 hours, wait 2-3 hours and count again. Look for a change in the pattern or not enough counts in 2 hours.  SEEK MEDICAL CARE IF:  · You feel less than 10 counts in 2 hours, tried twice.  · There is no movement in over an hour.  · The pattern is changing or taking longer each day to reach 10 counts in 2 hours.  · You feel the baby is not moving as he or she usually does.  Date: ____________ Movements: ____________ Start time: ____________ Finish time: ____________   Date: ____________ Movements: ____________ Start time: ____________ Finish time: ____________  Date: ____________ Movements: ____________ Start time: ____________ Finish time: ____________  Date: ____________ Movements:  ____________ Start time: ____________ Finish time: ____________  Date: ____________ Movements: ____________ Start time: ____________ Finish time: ____________  Date: ____________ Movements: ____________ Start time: ____________ Finish time: ____________  Date: ____________ Movements: ____________ Start time: ____________ Finish time: ____________  Date: ____________ Movements: ____________ Start time: ____________ Finish time: ____________   Date: ____________ Movements: ____________ Start time: ____________ Finish time: ____________  Date: ____________ Movements: ____________ Start time: ____________ Finish time: ____________  Date: ____________ Movements: ____________ Start time: ____________ Finish time: ____________  Date: ____________ Movements: ____________ Start time: ____________ Finish time: ____________  Date: ____________ Movements: ____________ Start time: ____________ Finish time: ____________  Date: ____________ Movements: ____________ Start time: ____________ Finish time: ____________  Date: ____________ Movements: ____________ Start time: ____________ Finish time: ____________   Date: ____________ Movements: ____________ Start time: ____________ Finish time: ____________  Date: ____________ Movements: ____________ Start time: ____________ Finish time: ____________  Date: ____________ Movements: ____________ Start time: ____________ Finish time: ____________  Date: ____________ Movements: ____________ Start time: ____________ Finish time: ____________  Date: ____________ Movements: ____________ Start time: ____________ Finish time: ____________  Date: ____________ Movements: ____________ Start time: ____________ Finish time: ____________  Date: ____________ Movements: ____________ Start time: ____________ Finish time: ____________   Date: ____________ Movements: ____________ Start time: ____________ Finish time: ____________  Date: ____________ Movements: ____________ Start time: ____________ Finish  time: ____________  Date: ____________ Movements: ____________ Start time: ____________ Finish time: ____________  Date: ____________ Movements: ____________ Start time:   ____________ Finish time: ____________  Date: ____________ Movements: ____________ Start time: ____________ Finish time: ____________  Date: ____________ Movements: ____________ Start time: ____________ Finish time: ____________  Date: ____________ Movements: ____________ Start time: ____________ Finish time: ____________   Date: ____________ Movements: ____________ Start time: ____________ Finish time: ____________  Date: ____________ Movements: ____________ Start time: ____________ Finish time: ____________  Date: ____________ Movements: ____________ Start time: ____________ Finish time: ____________  Date: ____________ Movements: ____________ Start time: ____________ Finish time: ____________  Date: ____________ Movements: ____________ Start time: ____________ Finish time: ____________  Date: ____________ Movements: ____________ Start time: ____________ Finish time: ____________  Date: ____________ Movements: ____________ Start time: ____________ Finish time: ____________   Date: ____________ Movements: ____________ Start time: ____________ Finish time: ____________  Date: ____________ Movements: ____________ Start time: ____________ Finish time: ____________  Date: ____________ Movements: ____________ Start time: ____________ Finish time: ____________  Date: ____________ Movements: ____________ Start time: ____________ Finish time: ____________  Date: ____________ Movements: ____________ Start time: ____________ Finish time: ____________  Date: ____________ Movements: ____________ Start time: ____________ Finish time: ____________  Date: ____________ Movements: ____________ Start time: ____________ Finish time: ____________   Date: ____________ Movements: ____________ Start time: ____________ Finish time: ____________  Date: ____________  Movements: ____________ Start time: ____________ Finish time: ____________  Date: ____________ Movements: ____________ Start time: ____________ Finish time: ____________  Date: ____________ Movements: ____________ Start time: ____________ Finish time: ____________  Date: ____________ Movements: ____________ Start time: ____________ Finish time: ____________  Date: ____________ Movements: ____________ Start time: ____________ Finish time: ____________  Date: ____________ Movements: ____________ Start time: ____________ Finish time: ____________   Date: ____________ Movements: ____________ Start time: ____________ Finish time: ____________  Date: ____________ Movements: ____________ Start time: ____________ Finish time: ____________  Date: ____________ Movements: ____________ Start time: ____________ Finish time: ____________  Date: ____________ Movements: ____________ Start time: ____________ Finish time: ____________  Date: ____________ Movements: ____________ Start time: ____________ Finish time: ____________  Date: ____________ Movements: ____________ Start time: ____________ Finish time: ____________     This information is not intended to replace advice given to you by your health care provider. Make sure you discuss any questions you have with your health care provider.     Document Released: 11/16/2006 Document Revised: 11/07/2014 Document Reviewed: 08/13/2012  Elsevier Interactive Patient Education ©2016 [employer]

## 2016-03-11 ENCOUNTER — Encounter (HOSPITAL_COMMUNITY): Payer: Self-pay | Admitting: *Deleted

## 2016-03-11 ENCOUNTER — Inpatient Hospital Stay (HOSPITAL_COMMUNITY)
Admission: AD | Admit: 2016-03-11 | Discharge: 2016-03-17 | DRG: 775 | Disposition: A | Discharge status: Home / Self Care | Payer: BLUE CROSS/BLUE SHIELD | Source: Ambulatory Visit | Attending: Obstetrics & Gynecology | Admitting: Obstetrics & Gynecology

## 2016-03-11 ENCOUNTER — Inpatient Hospital Stay (HOSPITAL_COMMUNITY): Payer: BLUE CROSS/BLUE SHIELD

## 2016-03-11 DIAGNOSIS — O1493 Unspecified pre-eclampsia, third trimester: Secondary | ICD-10-CM | POA: Diagnosis present

## 2016-03-11 DIAGNOSIS — Z6791 Unspecified blood type, Rh negative: Secondary | ICD-10-CM | POA: Diagnosis present

## 2016-03-11 DIAGNOSIS — O26893 Other specified pregnancy related conditions, third trimester: Secondary | ICD-10-CM | POA: Diagnosis present

## 2016-03-11 DIAGNOSIS — K219 Gastro-esophageal reflux disease without esophagitis: Secondary | ICD-10-CM | POA: Diagnosis present

## 2016-03-11 DIAGNOSIS — Z6841 Body Mass Index (BMI) 40.0 and over, adult: Secondary | ICD-10-CM

## 2016-03-11 DIAGNOSIS — O09521 Supervision of elderly multigravida, first trimester: Secondary | ICD-10-CM

## 2016-03-11 DIAGNOSIS — O3441 Maternal care for other abnormalities of cervix, first trimester: Secondary | ICD-10-CM

## 2016-03-11 DIAGNOSIS — Z818 Family history of other mental and behavioral disorders: Secondary | ICD-10-CM | POA: Diagnosis not present

## 2016-03-11 DIAGNOSIS — O99213 Obesity complicating pregnancy, third trimester: Secondary | ICD-10-CM

## 2016-03-11 DIAGNOSIS — Z3A33 33 weeks gestation of pregnancy: Secondary | ICD-10-CM | POA: Diagnosis not present

## 2016-03-11 DIAGNOSIS — R010 Benign and innocent cardiac murmurs: Secondary | ICD-10-CM | POA: Diagnosis not present

## 2016-03-11 DIAGNOSIS — Z801 Family history of malignant neoplasm of trachea, bronchus and lung: Secondary | ICD-10-CM | POA: Diagnosis not present

## 2016-03-11 DIAGNOSIS — Z823 Family history of stroke: Secondary | ICD-10-CM

## 2016-03-11 DIAGNOSIS — O9989 Other specified diseases and conditions complicating pregnancy, childbirth and the puerperium: Secondary | ICD-10-CM

## 2016-03-11 DIAGNOSIS — O26891 Other specified pregnancy related conditions, first trimester: Secondary | ICD-10-CM

## 2016-03-11 DIAGNOSIS — O30041 Twin pregnancy, dichorionic/diamniotic, first trimester: Secondary | ICD-10-CM | POA: Diagnosis present

## 2016-03-11 DIAGNOSIS — O09891 Supervision of other high risk pregnancies, first trimester: Secondary | ICD-10-CM

## 2016-03-11 DIAGNOSIS — O30043 Twin pregnancy, dichorionic/diamniotic, third trimester: Secondary | ICD-10-CM | POA: Diagnosis present

## 2016-03-11 DIAGNOSIS — E669 Obesity, unspecified: Secondary | ICD-10-CM | POA: Diagnosis present

## 2016-03-11 DIAGNOSIS — O1404 Mild to moderate pre-eclampsia, complicating childbirth: Secondary | ICD-10-CM | POA: Diagnosis present

## 2016-03-11 DIAGNOSIS — R8271 Bacteriuria: Secondary | ICD-10-CM

## 2016-03-11 DIAGNOSIS — O09529 Supervision of elderly multigravida, unspecified trimester: Secondary | ICD-10-CM

## 2016-03-11 DIAGNOSIS — R0602 Shortness of breath: Secondary | ICD-10-CM

## 2016-03-11 DIAGNOSIS — O321XX2 Maternal care for breech presentation, fetus 2: Secondary | ICD-10-CM | POA: Diagnosis present

## 2016-03-11 DIAGNOSIS — Z87891 Personal history of nicotine dependence: Secondary | ICD-10-CM

## 2016-03-11 DIAGNOSIS — O99214 Obesity complicating childbirth: Secondary | ICD-10-CM | POA: Diagnosis present

## 2016-03-11 DIAGNOSIS — Z833 Family history of diabetes mellitus: Secondary | ICD-10-CM

## 2016-03-11 DIAGNOSIS — Z8249 Family history of ischemic heart disease and other diseases of the circulatory system: Secondary | ICD-10-CM | POA: Diagnosis not present

## 2016-03-11 DIAGNOSIS — O9962 Diseases of the digestive system complicating childbirth: Secondary | ICD-10-CM | POA: Diagnosis present

## 2016-03-11 DIAGNOSIS — O1414 Severe pre-eclampsia complicating childbirth: Principal | ICD-10-CM | POA: Diagnosis present

## 2016-03-11 DIAGNOSIS — O30049 Twin pregnancy, dichorionic/diamniotic, unspecified trimester: Secondary | ICD-10-CM

## 2016-03-11 DIAGNOSIS — Z9889 Other specified postprocedural states: Secondary | ICD-10-CM

## 2016-03-11 DIAGNOSIS — Z3A34 34 weeks gestation of pregnancy: Secondary | ICD-10-CM

## 2016-03-11 DIAGNOSIS — O99891 Other specified diseases and conditions complicating pregnancy: Secondary | ICD-10-CM

## 2016-03-11 DIAGNOSIS — O360112 Maternal care for anti-D [Rh] antibodies, first trimester, fetus 2: Secondary | ICD-10-CM

## 2016-03-11 DIAGNOSIS — O321XX0 Maternal care for breech presentation, not applicable or unspecified: Secondary | ICD-10-CM | POA: Diagnosis not present

## 2016-03-11 DIAGNOSIS — O328XX2 Maternal care for other malpresentation of fetus, fetus 2: Secondary | ICD-10-CM | POA: Diagnosis not present

## 2016-03-11 DIAGNOSIS — O1490 Unspecified pre-eclampsia, unspecified trimester: Secondary | ICD-10-CM

## 2016-03-11 LAB — CBC
HCT: 29.4 % — ABNORMAL LOW (ref 36.0–46.0)
HEMOGLOBIN: 9.5 g/dL — AB (ref 12.0–15.0)
MCH: 30.3 pg (ref 26.0–34.0)
MCHC: 32.3 g/dL (ref 30.0–36.0)
MCV: 93.6 fL (ref 78.0–100.0)
PLATELETS: 275 10*3/uL (ref 150–400)
RBC: 3.14 MIL/uL — AB (ref 3.87–5.11)
RDW: 16.9 % — ABNORMAL HIGH (ref 11.5–15.5)
WBC: 10.9 10*3/uL — AB (ref 4.0–10.5)

## 2016-03-11 LAB — URINALYSIS, ROUTINE W REFLEX MICROSCOPIC
Bilirubin Urine: NEGATIVE
GLUCOSE, UA: NEGATIVE mg/dL
KETONES UR: NEGATIVE mg/dL
LEUKOCYTES UA: NEGATIVE
NITRITE: NEGATIVE
PH: 5.5 (ref 5.0–8.0)
Protein, ur: 100 mg/dL — AB
SPECIFIC GRAVITY, URINE: 1.01 (ref 1.005–1.030)

## 2016-03-11 LAB — BRAIN NATRIURETIC PEPTIDE: B Natriuretic Peptide: 158.8 pg/mL — ABNORMAL HIGH (ref 0.0–100.0)

## 2016-03-11 LAB — OB RESULTS CONSOLE GBS: GBS: NEGATIVE

## 2016-03-11 LAB — PROTEIN / CREATININE RATIO, URINE
Creatinine, Urine: 51 mg/dL
Protein Creatinine Ratio: 2.18 mg/mg{Cre} — ABNORMAL HIGH (ref 0.00–0.15)
Total Protein, Urine: 111 mg/dL

## 2016-03-11 LAB — TYPE AND SCREEN
ABO/RH(D): O NEG
ANTIBODY SCREEN: NEGATIVE

## 2016-03-11 LAB — COMPREHENSIVE METABOLIC PANEL
ALK PHOS: 213 U/L — AB (ref 38–126)
ALT: 18 U/L (ref 14–54)
AST: 29 U/L (ref 15–41)
Albumin: 2.7 g/dL — ABNORMAL LOW (ref 3.5–5.0)
Anion gap: 11 (ref 5–15)
BUN: 7 mg/dL (ref 6–20)
CALCIUM: 8.7 mg/dL — AB (ref 8.9–10.3)
CO2: 22 mmol/L (ref 22–32)
CREATININE: 0.46 mg/dL (ref 0.44–1.00)
Chloride: 104 mmol/L (ref 101–111)
GFR calc Af Amer: >60 mL/min (ref >60)
GFR calc non Af Amer: >60 mL/min (ref >60)
GLUCOSE: 102 mg/dL — AB (ref 65–99)
Potassium: 3.4 mmol/L — ABNORMAL LOW (ref 3.5–5.1)
SODIUM: 137 mmol/L (ref 135–145)
Total Bilirubin: 0.4 mg/dL (ref 0.3–1.2)
Total Protein: 6.1 g/dL — ABNORMAL LOW (ref 6.5–8.1)

## 2016-03-11 LAB — URINE MICROSCOPIC-ADD ON

## 2016-03-11 MED ORDER — CALCIUM CARBONATE ANTACID 500 MG PO CHEW
2.0000 | CHEWABLE_TABLET | ORAL | Status: DC | PRN
  Administered 2016-03-17: 400 mg via ORAL
  Filled 2016-03-11: qty 2

## 2016-03-11 MED ORDER — SODIUM CHLORIDE 0.9% FLUSH
3.0000 mL | INTRAVENOUS | Status: DC | PRN
Start: 2016-03-11 — End: 2016-03-17
  Administered 2016-03-12 – 2016-03-14 (×2): 3 mL via INTRAVENOUS
  Filled 2016-03-11 (×2): qty 3

## 2016-03-11 MED ORDER — PRENATAL MULTIVITAMIN CH
1.0000 | ORAL_TABLET | Freq: Every day | ORAL | Status: DC
  Administered 2016-03-12 – 2016-03-17 (×5): 1 via ORAL
  Filled 2016-03-11 (×5): qty 1

## 2016-03-11 MED ORDER — PANTOPRAZOLE SODIUM 40 MG PO TBEC
40.0000 mg | DELAYED_RELEASE_TABLET | Freq: Every day | ORAL | Status: DC
  Administered 2016-03-11 – 2016-03-17 (×6): 40 mg via ORAL
  Filled 2016-03-11 (×6): qty 1

## 2016-03-11 MED ORDER — LABETALOL HCL 100 MG PO TABS
200.0000 mg | ORAL_TABLET | Freq: Three times a day (TID) | ORAL | Status: DC
  Administered 2016-03-12 (×3): 200 mg via ORAL
  Filled 2016-03-11 (×3): qty 2

## 2016-03-11 MED ORDER — SODIUM CHLORIDE 0.9 % IV SOLN: 250.0000 mL | INTRAVENOUS | Status: DC | PRN

## 2016-03-11 MED ORDER — ACETAMINOPHEN 500 MG PO TABS
1000.0000 mg | ORAL_TABLET | Freq: Once | ORAL | Status: AC
  Administered 2016-03-11: 1000 mg via ORAL
  Filled 2016-03-11: qty 2

## 2016-03-11 MED ORDER — SODIUM CHLORIDE 0.9% FLUSH
3.0000 mL | Freq: Two times a day (BID) | INTRAVENOUS | Status: DC
  Administered 2016-03-12 – 2016-03-14 (×4): 3 mL via INTRAVENOUS

## 2016-03-11 MED ORDER — ZOLPIDEM TARTRATE 5 MG PO TABS: 5.0000 mg | ORAL_TABLET | Freq: Every evening | ORAL | Status: DC | PRN

## 2016-03-11 MED ORDER — ACETAMINOPHEN 325 MG PO TABS
650.0000 mg | ORAL_TABLET | ORAL | Status: DC | PRN
  Administered 2016-03-12 – 2016-03-14 (×2): 650 mg via ORAL
  Filled 2016-03-11 (×3): qty 2

## 2016-03-11 MED ORDER — DOCUSATE SODIUM 100 MG PO CAPS
100.0000 mg | ORAL_CAPSULE | Freq: Every day | ORAL | Status: DC
Start: 2016-03-12 — End: 2016-03-16
  Administered 2016-03-14: 100 mg via ORAL
  Filled 2016-03-11 (×2): qty 1

## 2016-03-11 NOTE — Discharge Summary (Signed)
Physician Discharge Summary  Patient ID: Katherine [name] MRN: [id number] DOB/AGE: [birth year] 35 y.o.  Admit date: 03/07/2016 Discharge date: 03/11/2016  Admission Diagnoses: Preeclampsia with concern for worsening pressures  Discharge Diagnoses:  Principal Problem:   Mild preeclampsia Active Problems:   Supervision of other high risk pregnancies, first trimester   History of LEEP (loop electrosurgical excision procedure) of cervix complicating pregnancy in first trimester   AMA (advanced maternal age) multigravida 35+   Dichorionic diamniotic twin pregnancy in first trimester   Rh negative status during pregnancy in first trimester, antepartum   Pre-eclampsia   Discharged Condition: good  Hospital Course: Katherine [name] is a 35 y.o. G3P1011 at 8457w2d who was admitted for elevated blood pressures in the setting of a previous diagnosis of preeclampsia based on BP elevation with proteinuria (24hr urine 1.81). The patient was in twice weekly testing but had worsening BP. She also had a HA was eventually improved with flexeril.  She was started on Labetalol 200mg  TID. BP improved on thsi regimen. She was asymptomatic on the day of discharge.  With regards to fetal well being,  BMZ was received for fetal lung maturity on 5/8 and 5/9. Fetal monitoring remained reassuring.  She was discharged with return precautions for preeclampsia.   Consults: None  Significant Diagnostic Studies: labs: Prior 24 hour urine protein 1.81  Treatments: flexeril  Discharge Exam: Blood pressure 158/75, pulse 79, temperature 97.9 F (36.6 C), temperature source Oral, resp. rate 20, height 5\' 10"  (1.778 m), weight 306 lb 1.6 oz (138.846 kg), last menstrual period 07/21/2015. General appearance: alert, cooperative and appears stated age  CV: RR Lungs: normal WOB Abd: gravid, no RUQ pain Ext: 1+ pitting edema. No clonus  Disposition: 01-Home or Self Care  Discharge Instructions    Discharge  activity:  No Restrictions    Complete by:  As directed      Discharge diet:  No restrictions    Complete by:  As directed      Fetal Kick Count:  Lie on our left side for one hour after a meal, and count the number of times your baby kicks.  If it is less than 5 times, get up, move around and drink some juice.  Repeat the test 30 minutes later.  If it is still less than 5 kicks in an hour, notify your doctor.    Complete by:  As directed      No sexual activity restrictions    Complete by:  As directed      Notify physician for a general feeling that "something is not right"    Complete by:  As directed      Notify physician for increase or change in vaginal discharge    Complete by:  As directed      Notify physician for intestinal cramps, with or without diarrhea, sometimes described as "gas pain"    Complete by:  As directed      Notify physician for leaking of fluid    Complete by:  As directed      Notify physician for low, dull backache, unrelieved by heat or Tylenol    Complete by:  As directed      Notify physician for menstrual like cramps    Complete by:  As directed      Notify physician for pelvic pressure    Complete by:  As directed      Notify physician for uterine contractions.  These may be painless and  feel like the uterus is tightening or the baby is  "balling up"    Complete by:  As directed      Notify physician for vaginal bleeding    Complete by:  As directed      PRETERM LABOR:  Includes any of the follwing symptoms that occur between 20 - [redacted] weeks gestation.  If these symptoms are not stopped, preterm labor can result in preterm delivery, placing your baby at risk    Complete by:  As directed             Medication List    TAKE these medications        acetaminophen 500 MG tablet  Commonly known as:  TYLENOL  Take 1,000 mg by mouth every 6 (six) hours as needed for headache.     CONCEPT OB 130-92.4-1 MG Caps  Take 1 tablet by mouth daily.      INTEGRA F 125-1 MG Caps  Take 1 capsule by mouth daily.     labetalol 200 MG tablet  Commonly known as:  NORMODYNE  Take 1 tablet (200 mg total) by mouth 3 (three) times daily.     Medical Compression Stockings Misc  1 Device by Does not apply route daily.     pantoprazole 40 MG tablet  Commonly known as:  PROTONIX  Take 1 tablet (40 mg total) by mouth daily.     triamcinolone ointment 0.1 %  Commonly known as:  KENALOG  Apply 1 application topically 2 (two) times daily.         Signed: Federico Flake 03/11/2016, 6:43 PM

## 2016-03-11 NOTE — MAU Note (Signed)
Patient presents to mau with c/o headache that has gotten worse since yesterday. Endorses shortness of breath and pain in upper part of back; some relief by sitting up. Currently on medication for blood pressure. States has increase in swelling.

## 2016-03-11 NOTE — H&P (Signed)
ANTEPARTUM ADMISSION HISTORY AND PHYSICAL NOTE   History of Present Illness: Katherine [name] is a 35 y.o. G3P1011 at [redacted]w[redacted]d    Discharged yesterday after hospitalization for preE, mild.   Returns because today developed mild ha, epigastric pain, and new SOB, which were her return precautions. No vision change.   No bleeding, ctxns, or lof  Patient Active Problem List   Diagnosis Date Noted  . Preeclampsia 03/11/2016  . Mild preeclampsia   . Pre-eclampsia 03/07/2016  . Dichorionic diamniotic twin pregnancy in first trimester 10/12/2015  . Rh negative status during pregnancy in first trimester, antepartum 10/12/2015  . Asymptomatic bacteriuria during pregnancy in first trimester 10/12/2015  . History of LEEP (loop electrosurgical excision procedure) of cervix complicating pregnancy in first trimester 09/25/2015  . AMA (advanced maternal age) multigravida 35+ 09/25/2015  . Supervision of other high risk pregnancies, first trimester 09/14/2015  . Depression 01/01/2015  . Mass of breast, left 08/30/2013  . Family history of DVT 08/20/2013  . Concentration deficit 08/12/2013  . GERD (gastroesophageal reflux disease) 01/22/2013  . Obesity 01/22/2013  . Elevated BP 01/22/2013  . History of gallstones 01/08/2013  . Alternating constipation and diarrhea 01/08/2013  . Abdominal pain, chronic, epigastric 01/08/2013    Past Medical History  Diagnosis Date  . Gall stones   . History of gallstones 01/08/2013  . GERD (gastroesophageal reflux disease) 01/22/2013  . Alternating constipation and diarrhea 01/08/2013  . Abnormal Pap smear 2008    Leep  . History of depression     Past Surgical History  Procedure Laterality Date  . Wisdom tooth extraction      OB History  Gravida Para Term Preterm AB SAB TAB Ectopic Multiple Living  # Outcome Date GA Lbr Len/2nd Weight Sex Delivery Anes PTL Lv  3 Current           2 Term 11/18/00 [redacted]w[redacted]d   F Vag-Spont   Y  1 TAB                Social History   Social History  . Marital Status: Married    Spouse Name: N/A  . Number of Children: N/A  . Years of Education: N/A   Occupational History  . loan analyst    Social History Main Topics  . Smoking status: Former Smoker -- 1.50 packs/day for 15 years    Types: Cigarettes  . Smokeless tobacco: None  . Alcohol Use: No  . Drug Use: No  . Sexual Activity:    Partners: Male   Other Topics Concern  . None   Social History Narrative    Family History  Problem Relation Age of Onset  . Migraines Mother   . Diverticulitis Mother   . Depression Mother   . Hyperlipidemia Mother   . Hypertension Mother   . Alcoholism      grandparent  . Lung cancer      grandparent  . Diabetes      aunt  . Stroke      greatgrandmother  . Diabetes Maternal Grandmother     Allergies  Allergen Reactions  . Macrobid [Nitrofurantoin] Nausea And Vomiting    Hyperemesis     Prescriptions prior to admission  Medication Sig Dispense Refill Last Dose  . acetaminophen (TYLENOL) 500 MG tablet Take 1,000 mg by mouth every 6 (six) hours as needed for headache.   03/11/2016 at Unknown time  .  Fe Fum-FePoly-FA-Vit C-Vit B3 (INTEGRA F) 125-1 MG CAPS Take 1 capsule by mouth daily. 30 capsule 6 Past Week at Unknown time  . labetalol (NORMODYNE) 200 MG tablet Take 1 tablet (200 mg total) by mouth 3 (three) times daily. 90 tablet 2 03/11/2016 at 1800  . pantoprazole (PROTONIX) 40 MG tablet Take 1 tablet (40 mg total) by mouth daily. 30 tablet 0 03/10/2016 at Unknown time  . Prenat w/o A Vit-FeFum-FePo-FA (CONCEPT OB) 130-92.4-1 MG CAPS Take 1 tablet by mouth daily. 30 capsule 12 03/11/2016 at Unknown time  . triamcinolone ointment (KENALOG) 0.1 % Apply 1 application topically 2 (two) times daily. 30 g 0 Past Month at Unknown time  . Elastic Bandages & Supports (MEDICAL COMPRESSION STOCKINGS) MISC 1 Device by Does not apply route daily. 1 each 1  at Unknown time    Review of  Systems - Negative except as per hpi  Vitals:  BP 145/72 mmHg  Pulse 75  Temp(Src) 97.5 F (36.4 C) (Oral)  Resp 20  Ht 5\' 10"  (1.778 m)  Wt 311 lb 1.3 oz (141.105 kg)  BMI 44.64 kg/m2  SpO2 98%  LMP 07/21/2015 Physical Examination: CONSTITUTIONAL: Well-developed, well-nourished female in no acute distress.  HENT:  Normocephalic, atraumatic, External right and left ear normal. Oropharynx is clear and moist EYES: Conjunctivae and EOM are normal. Pupils are equal, round, and reactive to light. No scleral icterus.  NECK: Normal range of motion, supple, no masses SKIN: Skin is warm and dry. No rash noted. Not diaphoretic. No erythema. No pallor. Pitting edema to thighs NEUROLGIC: Alert and oriented to person, place, and time. Normal reflexes, muscle tone coordination. No cranial nerve deficit noted. PSYCHIATRIC: Normal mood and affect. Normal behavior. Normal judgment and thought content. CARDIOVASCULAR: Normal heart rate noted, regular rhythm. Loud systolic murmur RESPIRATORY: Effort and breath sounds normal, no problems with respiration noted ABDOMEN: Soft, nontender, nondistended, gravid. MUSCULOSKELETAL: Normal range of motion. No edema and no tenderness. 2+ distal pulses.  Cervix: deferred Membranes:intact Fetal Monitoring: 140/mod/+a/-d, 145/mod/+a/-d Tocometer: Flat  Labs:  Results for orders placed or performed during the hospital encounter of 03/11/16 (from the past 24 hour(s))  Protein / creatinine ratio, urine   Collection Time: 03/11/16  5:50 PM  Result Value Ref Range   Creatinine, Urine 51.00 mg/dL   Total Protein, Urine 111 mg/dL   Protein Creatinine Ratio 2.18 (H) 0.00 - 0.15 mg/mg[Cre]  Urinalysis, Routine w reflex microscopic (not at [facility])   Collection Time: 03/11/16  5:50 PM  Result Value Ref Range   Color, Urine YELLOW YELLOW   APPearance HAZY (A) CLEAR   Specific Gravity, Urine 1.010 1.005 - 1.030   pH 5.5 5.0 - 8.0   Glucose, UA NEGATIVE NEGATIVE mg/dL    Hgb urine dipstick TRACE (A) NEGATIVE   Bilirubin Urine NEGATIVE NEGATIVE   Ketones, ur NEGATIVE NEGATIVE mg/dL   Protein, ur 409 (A) NEGATIVE mg/dL   Nitrite NEGATIVE NEGATIVE   Leukocytes, UA NEGATIVE NEGATIVE  Urine microscopic-add on   Collection Time: 03/11/16  5:50 PM  Result Value Ref Range   Squamous Epithelial / LPF 0-5 (A) NONE SEEN   WBC, UA 0-5 0 - 5 WBC/hpf   RBC / HPF 0-5 0 - 5 RBC/hpf   Bacteria, UA FEW (A) NONE SEEN  CBC   Collection Time: 03/11/16  6:05 PM  Result Value Ref Range   WBC 10.9 (H) 4.0 - 10.5 K/uL   RBC 3.14 (L) 3.87 - 5.11 MIL/uL  Hemoglobin 9.5 (L) 12.0 - 15.0 g/dL   HCT 91.429.4 (L) 78.236.0 - 95.646.0 %   MCV 93.6 78.0 - 100.0 fL   MCH 30.3 26.0 - 34.0 pg   MCHC 32.3 30.0 - 36.0 g/dL   RDW 21.316.9 (H) 08.611.5 - 57.815.5 %   Platelets 275 150 - 400 K/uL  Comprehensive metabolic panel   Collection Time: 03/11/16  6:05 PM  Result Value Ref Range   Sodium 137 135 - 145 mmol/L   Potassium 3.4 (L) 3.5 - 5.1 mmol/L   Chloride 104 101 - 111 mmol/L   CO2 22 22 - 32 mmol/L   Glucose, Bld 102 (H) 65 - 99 mg/dL   BUN 7 6 - 20 mg/dL   Creatinine, Ser 4.690.46 0.44 - 1.00 mg/dL   Calcium 8.7 (L) 8.9 - 10.3 mg/dL   Total Protein 6.1 (L) 6.5 - 8.1 g/dL   Albumin 2.7 (L) 3.5 - 5.0 g/dL   AST 29 15 - 41 U/L   ALT 18 14 - 54 U/L   Alkaline Phosphatase 213 (H) 38 - 126 U/L   Total Bilirubin 0.4 0.3 - 1.2 mg/dL   GFR calc non Af Amer >60 >60 mL/min   GFR calc Af Amer >60 >60 mL/min   Anion gap 11 5 - 15    Imaging Studies: [facility] Mfm Ob Follow Up  02/19/2016  OBSTETRICAL ULTRASOUND: This exam was performed within a [place] [facility]. The OB US report was generated in the AS system, and faxed to the ordering physician.  This report is available in the [facility] [employer] PACS. See the AS Obstetric US report via the Image Link.  [facility] Mfm Ob Follow Up Addl Gest  02/19/2016  OBSTETRICAL ULTRASOUND: This exam was performed within a [place] [facility]. The OB US  report was generated in the AS system, and faxed to the ordering physician.  This report is available in the [facility] [employer] PACS. See the AS Obstetric US report via the Image Link.    Assessment and Plan:  # preE - initial bp severely elevated, down to mild elevation w/o bp meds. Symptoms concerning for possible severe disease. Labs not suggestive of severe disease - admit for monitoring - continue home labetalol 200 tid - repeat u/s w/ dopplers on Monday - f/u GBS - will check bnp and cxr given new murmur and sob to eval for pulmonary sequelae of preeclampsia as well as signs heart failure - f/u 24-hour urine  # di-di twins - A is vertex, b is breech, confirmed via bedside [place]. Patient desires vaginal birth  Admit to Antenatal Routine antenatal care  Silvano BilisNoah B Wouk, MD Attending Obstetrician & Gynecologist Faculty Practice, [facility] [place]

## 2016-03-12 ENCOUNTER — Inpatient Hospital Stay (HOSPITAL_COMMUNITY): Payer: BLUE CROSS/BLUE SHIELD

## 2016-03-12 DIAGNOSIS — O30043 Twin pregnancy, dichorionic/diamniotic, third trimester: Secondary | ICD-10-CM

## 2016-03-12 DIAGNOSIS — O1414 Severe pre-eclampsia complicating childbirth: Secondary | ICD-10-CM

## 2016-03-12 DIAGNOSIS — O328XX2 Maternal care for other malpresentation of fetus, fetus 2: Secondary | ICD-10-CM

## 2016-03-12 DIAGNOSIS — Z3A34 34 weeks gestation of pregnancy: Secondary | ICD-10-CM

## 2016-03-12 LAB — CBC
HEMATOCRIT: 28.8 % — AB (ref 36.0–46.0)
HEMOGLOBIN: 9.3 g/dL — AB (ref 12.0–15.0)
MCH: 30.2 pg (ref 26.0–34.0)
MCHC: 32.3 g/dL (ref 30.0–36.0)
MCV: 93.5 fL (ref 78.0–100.0)
Platelets: 262 10*3/uL (ref 150–400)
RBC: 3.08 MIL/uL — ABNORMAL LOW (ref 3.87–5.11)
RDW: 16.7 % — AB (ref 11.5–15.5)
WBC: 10.3 10*3/uL (ref 4.0–10.5)

## 2016-03-12 LAB — COMPREHENSIVE METABOLIC PANEL
ALBUMIN: 2.5 g/dL — AB (ref 3.5–5.0)
ALK PHOS: 198 U/L — AB (ref 38–126)
ALT: 19 U/L (ref 14–54)
ANION GAP: 9 (ref 5–15)
AST: 24 U/L (ref 15–41)
BUN: 8 mg/dL (ref 6–20)
CHLORIDE: 106 mmol/L (ref 101–111)
CO2: 24 mmol/L (ref 22–32)
CREATININE: 0.56 mg/dL (ref 0.44–1.00)
Calcium: 8.3 mg/dL — ABNORMAL LOW (ref 8.9–10.3)
GFR calc Af Amer: >60 mL/min (ref >60)
GFR calc non Af Amer: >60 mL/min (ref >60)
GLUCOSE: 96 mg/dL (ref 65–99)
POTASSIUM: 3.8 mmol/L (ref 3.5–5.1)
SODIUM: 139 mmol/L (ref 135–145)
Total Bilirubin: 0.5 mg/dL (ref 0.3–1.2)
Total Protein: 5.7 g/dL — ABNORMAL LOW (ref 6.5–8.1)

## 2016-03-12 MED ORDER — LABETALOL HCL 200 MG PO TABS
300.0000 mg | ORAL_TABLET | Freq: Three times a day (TID) | ORAL | Status: DC
  Administered 2016-03-12 – 2016-03-15 (×10): 300 mg via ORAL
  Filled 2016-03-12 (×12): qty 1

## 2016-03-12 NOTE — Progress Notes (Signed)
Patient ID: Katherine SheenMartha L [name], female   DOB: May 17, 1981, 35 y.o.   MRN: [id number] Prelim [place] update: BPP 8/8 and 8/8 EFW  Baby A 55th%ile  Baby B 56th%ile No discordance. Dopplers cancelled.  Joshuan Bolander L. Harraway-Smith, M.D., [facility]

## 2016-03-12 NOTE — Progress Notes (Signed)
Patient ID: Katherine [name], female   DOB: January 31, 1981, 35 y.o.   MRN: [id number] FACULTY PRACTICE ANTEPARTUM(COMPREHENSIVE) NOTE  MELISSAANN DIZDAREVIC is a 35 y.o. [phone] with DiDi twins Estimated Date of Delivery: 04/26/16   By  early ultrasound [redacted]w[redacted]d  who is admitted for observation.    Fetal presentation is unsure. Length of Stay:  1  Days  Date of admission:03/11/2016  Subjective: Dull occipital and parietal headache, relieved with accupressure therapy Patient reports the fetal movement as active. Patient reports uterine contraction  activity as none. Patient reports  vaginal bleeding as none. Patient describes fluid per vagina as None.  Vitals:  Blood pressure 142/74, pulse 73, temperature 98 F (36.7 C), temperature source Axillary, resp. rate 18, height 5\' 10"  (1.778 m), weight 311 lb 1.3 oz (141.105 kg), last menstrual period 07/21/2015, SpO2 97 %. Filed Vitals:   03/11/16 2000 03/11/16 2153 03/12/16 0012 03/12/16 0605  BP: 147/72 151/78 138/68 142/74  Pulse: 74 76 75 73  Temp: 98.3 F (36.8 C) 97.7 F (36.5 C) 98 F (36.7 C)   TempSrc: Oral Oral Axillary   Resp: 20 18 18    Height:      Weight:      SpO2: 99% 100% 97%    Physical Examination:  General appearance - alert, well appearing, and in no distress Abdomen - benign Fundal Height:  size equals dates  Extremities: extremities normal, atraumatic, no cyanosis or edema and Homans sign is negative, no sign of DVT with DTRs 2+ bilaterally Membranes:intact  Fetal Monitoring:  Reactive x2     Labs:  Results for orders placed or performed during the hospital encounter of 03/11/16 (from the past 24 hour(s))  Protein / creatinine ratio, urine   Collection Time: 03/11/16  5:50 PM  Result Value Ref Range   Creatinine, Urine 51.00 mg/dL   Total Protein, Urine 111 mg/dL   Protein Creatinine Ratio 2.18 (H) 0.00 - 0.15 mg/mg[Cre]  Urinalysis, Routine w reflex microscopic (not at [facility])   Collection Time: 03/11/16  5:50  PM  Result Value Ref Range   Color, Urine YELLOW YELLOW   APPearance HAZY (A) CLEAR   Specific Gravity, Urine 1.010 1.005 - 1.030   pH 5.5 5.0 - 8.0   Glucose, UA NEGATIVE NEGATIVE mg/dL   Hgb urine dipstick TRACE (A) NEGATIVE   Bilirubin Urine NEGATIVE NEGATIVE   Ketones, ur NEGATIVE NEGATIVE mg/dL   Protein, ur 657 (A) NEGATIVE mg/dL   Nitrite NEGATIVE NEGATIVE   Leukocytes, UA NEGATIVE NEGATIVE  Urine microscopic-add on   Collection Time: 03/11/16  5:50 PM  Result Value Ref Range   Squamous Epithelial / LPF 0-5 (A) NONE SEEN   WBC, UA 0-5 0 - 5 WBC/hpf   RBC / HPF 0-5 0 - 5 RBC/hpf   Bacteria, UA FEW (A) NONE SEEN  CBC   Collection Time: 03/11/16  6:05 PM  Result Value Ref Range   WBC 10.9 (H) 4.0 - 10.5 K/uL   RBC 3.14 (L) 3.87 - 5.11 MIL/uL   Hemoglobin 9.5 (L) 12.0 - 15.0 g/dL   HCT 84.6 (L) 96.2 - 95.2 %   MCV 93.6 78.0 - 100.0 fL   MCH 30.3 26.0 - 34.0 pg   MCHC 32.3 30.0 - 36.0 g/dL   RDW 84.1 (H) 32.4 - 40.1 %   Platelets 275 150 - 400 K/uL  Comprehensive metabolic panel   Collection Time: 03/11/16  6:05 PM  Result Value Ref Range   Sodium 137 135 -  145 mmol/L   Potassium 3.4 (L) 3.5 - 5.1 mmol/L   Chloride 104 101 - 111 mmol/L   CO2 22 22 - 32 mmol/L   Glucose, Bld 102 (H) 65 - 99 mg/dL   BUN 7 6 - 20 mg/dL   Creatinine, Ser 1.47 0.44 - 1.00 mg/dL   Calcium 8.7 (L) 8.9 - 10.3 mg/dL   Total Protein 6.1 (L) 6.5 - 8.1 g/dL   Albumin 2.7 (L) 3.5 - 5.0 g/dL   AST 29 15 - 41 U/L   ALT 18 14 - 54 U/L   Alkaline Phosphatase 213 (H) 38 - 126 U/L   Total Bilirubin 0.4 0.3 - 1.2 mg/dL   GFR calc non Af Amer >60 >60 mL/min   GFR calc Af Amer >60 >60 mL/min   Anion gap 11 5 - 15  Brain natriuretic peptide   Collection Time: 03/11/16  6:05 PM  Result Value Ref Range   B Natriuretic Peptide 158.8 (H) 0.0 - 100.0 pg/mL  Type and screen [facility] [place]   Collection Time: 03/11/16  6:05 PM  Result Value Ref Range   ABO/RH(D) O NEG    Antibody Screen  NEG    Sample Expiration 03/14/2016   CBC   Collection Time: 03/12/16  5:42 AM  Result Value Ref Range   WBC 10.3 4.0 - 10.5 K/uL   RBC 3.08 (L) 3.87 - 5.11 MIL/uL   Hemoglobin 9.3 (L) 12.0 - 15.0 g/dL   HCT 82.9 (L) 56.2 - 13.0 %   MCV 93.5 78.0 - 100.0 fL   MCH 30.2 26.0 - 34.0 pg   MCHC 32.3 30.0 - 36.0 g/dL   RDW 86.5 (H) 78.4 - 69.6 %   Platelets 262 150 - 400 K/uL  Comprehensive metabolic panel   Collection Time: 03/12/16  5:42 AM  Result Value Ref Range   Sodium 139 135 - 145 mmol/L   Potassium 3.8 3.5 - 5.1 mmol/L   Chloride 106 101 - 111 mmol/L   CO2 24 22 - 32 mmol/L   Glucose, Bld 96 65 - 99 mg/dL   BUN 8 6 - 20 mg/dL   Creatinine, Ser 2.95 0.44 - 1.00 mg/dL   Calcium 8.3 (L) 8.9 - 10.3 mg/dL   Total Protein 5.7 (L) 6.5 - 8.1 g/dL   Albumin 2.5 (L) 3.5 - 5.0 g/dL   AST 24 15 - 41 U/L   ALT 19 14 - 54 U/L   Alkaline Phosphatase 198 (H) 38 - 126 U/L   Total Bilirubin 0.5 0.3 - 1.2 mg/dL   GFR calc non Af Amer >60 >60 mL/min   GFR calc Af Amer >60 >60 mL/min   Anion gap 9 5 - 15    Imaging Studies:      Medications:  Scheduled . docusate sodium  100 mg Oral Daily  . labetalol  200 mg Oral Q8H  . pantoprazole  40 mg Oral Daily  . prenatal multivitamin  1 tablet Oral Q1200  . sodium chloride flush  3 mL Intravenous Q12H   I have reviewed the patient's current medications.  ASSESSMENT: [id number] [redacted]w[redacted]d Estimated Date of Delivery: 04/26/16  DiDi Twins  Patient Active Problem List   Diagnosis Date Noted  . Preeclampsia 03/11/2016  . Mild preeclampsia   . Pre-eclampsia 03/07/2016  . Dichorionic diamniotic twin pregnancy in first trimester 10/12/2015  . Rh negative status during pregnancy in first trimester, antepartum 10/12/2015  . Asymptomatic bacteriuria during pregnancy in first trimester 10/12/2015  .  History of LEEP (loop electrosurgical excision procedure) of cervix complicating pregnancy in first trimester 09/25/2015  . AMA (advanced maternal age)  multigravida 35+ 09/25/2015  . Supervision of other high risk pregnancies, first trimester 09/14/2015  . Depression 01/01/2015  . Mass of breast, left 08/30/2013  . Family history of DVT 08/20/2013  . Concentration deficit 08/12/2013  . GERD (gastroesophageal reflux disease) 01/22/2013  . Obesity 01/22/2013  . Elevated BP 01/22/2013  . History of gallstones 01/08/2013  . Alternating constipation and diarrhea 01/08/2013  . Abdominal pain, chronic, epigastric 01/08/2013    PLAN: 24 hour urine and in house observation in progress Will probably keep til Monday anyway to do fetal Doppler studies and observe pt course  EURE,LUTHER H 03/12/2016,7:40 AM

## 2016-03-13 ENCOUNTER — Inpatient Hospital Stay (HOSPITAL_COMMUNITY): Payer: BLUE CROSS/BLUE SHIELD

## 2016-03-13 DIAGNOSIS — R010 Benign and innocent cardiac murmurs: Secondary | ICD-10-CM

## 2016-03-13 DIAGNOSIS — O30043 Twin pregnancy, dichorionic/diamniotic, third trimester: Secondary | ICD-10-CM

## 2016-03-13 DIAGNOSIS — Z3A33 33 weeks gestation of pregnancy: Secondary | ICD-10-CM

## 2016-03-13 DIAGNOSIS — O1493 Unspecified pre-eclampsia, third trimester: Secondary | ICD-10-CM

## 2016-03-13 LAB — CULTURE, BETA STREP (GROUP B ONLY)

## 2016-03-13 LAB — PROTEIN, URINE, 24 HOUR
COLLECTION INTERVAL-UPROT: 24 h
Protein, 24H Urine: 3833 mg/d — ABNORMAL HIGH (ref 50–100)
Protein, Urine: 219 mg/dL
URINE TOTAL VOLUME-UPROT: 1750 mL

## 2016-03-13 LAB — ECHOCARDIOGRAM COMPLETE
HEIGHTINCHES: 70 in
Weight: 5004.8 oz

## 2016-03-13 LAB — CREATININE CLEARANCE, URINE, 24 HOUR
CREAT CLEAR: 231 mL/min — AB (ref 75–115)
CREATININE 24H UR: 1861 mg/d — AB (ref 600–1800)
CREATININE, URINE: 106.37 mg/dL
Collection Interval-CRCL: 24 hours
Urine Total Volume-CRCL: 1750 mL

## 2016-03-13 NOTE — Progress Notes (Signed)
Echocardiogram Complete   Katherine Junglingiffany Tashima [name] [facility] 03/13/16 10:20 AM

## 2016-03-13 NOTE — Progress Notes (Signed)
Patient ID: Katherine [name], female   DOB: 1980-12-14, 35 y.o.   MRN: [id number] ACULTY PRACTICE ANTEPARTUM COMPREHENSIVE PROGRESS NOTE  Katherine [name] is a 35 y.o. G3P1011 at 2661w5d  who is admitted for HA, epigastric pain and SOB    Fetal presentation is unsure. Length of Stay:  2  Days  Subjective: Pt reports that SOB is improved but, still present.  She reports HA that has been present for 1 week.  Better today but, still present. NO RUQ pain. Patient reports good fetal movement.  She reports 1 uterine contraction, no bleeding and no loss of fluid per vagina.  Vitals:  Blood pressure 133/64, pulse 75, temperature 98.7 F (37.1 C), temperature source Axillary, resp. rate 18, height 5\' 10"  (1.778 m), weight 310 lb 11.2 oz (140.933 kg), last menstrual period 07/21/2015, SpO2 97 %. Physical Examination: General appearance - alert, well appearing, and in no distress Chest - clear to auscultation, no wheezes, rales or rhonchi, symmetric air entry Heart - normal rate, regular rhythm, normal S1, S2, no murmurs, rubs, clicks or gallops, systolic murmur 4/6 at 2nd right intercostal space Abdomen - soft, nontender, nondistended, no masses or organomegaly gravid Extremities - 2+ pitting edema to knees.  symmetric Cervical Exam: Not evaluated.  Membranes:intact  Fetal Monitoring:  Baseline: 150's/150's bpm, Variability: Good {> 6 bpm), Accelerations: Reactive and Decelerations: Absent  Labs:  CMP Latest Ref Rng 03/12/2016 03/11/2016 03/07/2016  Glucose 65 - 99 mg/dL 96 322(G102(H) 254(Y138(H)  BUN 6 - 20 mg/dL 8 7 6   Creatinine 0.44 - 1.00 mg/dL 7.060.56 2.370.46 6.280.59  Sodium 135 - 145 mmol/L 139 137 137  Potassium 3.5 - 5.1 mmol/L 3.8 3.4(L) 3.5  Chloride 101 - 111 mmol/L 106 104 108  CO2 22 - 32 mmol/L 24 22 21(L)  Calcium 8.9 - 10.3 mg/dL 8.3(L) 8.7(L) 8.7(L)  Total Protein 6.5 - 8.1 g/dL 3.1(D5.7(L) 6.1(L) 6.4(L)  Total Bilirubin 0.3 - 1.2 mg/dL 0.5 0.4 0.4  Alkaline Phos 38 - 126 U/L 198(H) 213(H)  211(H)  AST 15 - 41 U/L 24 29 24   ALT 14 - 54 U/L 19 18 15   24  hour urine protein 3833 Pr:Cr ratio 2.19   Imaging Studies:    No doscordant growth.  Final results pending.  BPP 8/8 and 8/8   Medications:  Scheduled . docusate sodium  100 mg Oral Daily  . labetalol  300 mg Oral Q8H  . pantoprazole  40 mg Oral Daily  . prenatal multivitamin  1 tablet Oral Q1200  . sodium chloride flush  3 mL Intravenous Q12H   I have reviewed the patient's current medications.  ASSESSMENT: Patient Active Problem List   Diagnosis Date Noted  . Preeclampsia 03/11/2016  . Mild preeclampsia   . Pre-eclampsia 03/07/2016  . Dichorionic diamniotic twin pregnancy in first trimester 10/12/2015  . Rh negative status during pregnancy in first trimester, antepartum 10/12/2015  . Asymptomatic bacteriuria during pregnancy in first trimester 10/12/2015  . History of LEEP (loop electrosurgical excision procedure) of cervix complicating pregnancy in first trimester 09/25/2015  . AMA (advanced maternal age) multigravida 35+ 09/25/2015  . Supervision of other high risk pregnancies, first trimester 09/14/2015  . Depression 01/01/2015  . Mass of breast, left 08/30/2013  . Family history of DVT 08/20/2013  . Concentration deficit 08/12/2013  . GERD (gastroesophageal reflux disease) 01/22/2013  . Obesity 01/22/2013  . Elevated BP 01/22/2013  . History of gallstones 01/08/2013  . Alternating constipation and diarrhea 01/08/2013  .  Abdominal pain, chronic, epigastric 01/08/2013    PLAN: Labetolol dosage increased to  tid Continue inpt observation for now Continue routine antenatal care. ECHO today to eval new onset murmer in the face of SOB and preeclampsia with severe features    HARRAWAY-SMITH, Cason Dabney 03/13/2016,7:02 AM

## 2016-03-14 ENCOUNTER — Encounter: Payer: BLUE CROSS/BLUE SHIELD | Admitting: Obstetrics & Gynecology

## 2016-03-14 ENCOUNTER — Encounter: Payer: BLUE CROSS/BLUE SHIELD | Admitting: Advanced Practice Midwife

## 2016-03-14 DIAGNOSIS — O30043 Twin pregnancy, dichorionic/diamniotic, third trimester: Secondary | ICD-10-CM

## 2016-03-14 DIAGNOSIS — O328XX2 Maternal care for other malpresentation of fetus, fetus 2: Secondary | ICD-10-CM

## 2016-03-14 DIAGNOSIS — Z3A34 34 weeks gestation of pregnancy: Secondary | ICD-10-CM

## 2016-03-14 DIAGNOSIS — O1414 Severe pre-eclampsia complicating childbirth: Secondary | ICD-10-CM

## 2016-03-14 NOTE — Progress Notes (Signed)
Patient ID: Katherine [name], female   DOB: 1980-11-22, 35 y.o.   MRN: [id number] Patient ID: Katherine [name], female   DOB: [birth year], 35 y.o.   MRN: [id number] ACULTY PRACTICE ANTEPARTUM COMPREHENSIVE PROGRESS NOTE  Katherine [name] is a 35 y.o. G3P1011 at [redacted]w[redacted]d  who is admitted for HA, epigastric pain and SOB    Fetal presentation is unsure.  Cephalic/breech Length of Stay:  3  Days  Subjective: Pt reports that SOB is improved but, still present.  She reports HA that has been present for 1 week.  Better today but, still present. NO RUQ pain. Patient reports good fetal movement.  She reports 1 uterine contraction, no bleeding and no loss of fluid per vagina.  Vitals:  Blood pressure 147/76, pulse 75, temperature 98.6 F (37 C), temperature source Oral, resp. rate 16, height  (1.778 m), weight 312 lb 12.8 oz (141.885 kg), last menstrual period 07/21/2015, SpO2 97 %. Physical Examination: General appearance - alert, well appearing, and in no distress Chest - clear to auscultation, no wheezes, rales or rhonchi, symmetric air entry Heart - normal rate, regular rhythm, normal S1, S2, no murmurs, rubs, clicks or gallops, systolic murmur 4/6 at 2nd right intercostal space Abdomen - soft, nontender, nondistended, no masses or organomegaly gravid Extremities - 2+ pitting edema to knees.  symmetric Cervical Exam: Not evaluated.  Membranes:intact  Fetal Monitoring:  Baseline: 150's/150's bpm, Variability: Good {> 6 bpm), Accelerations: Reactive and Decelerations: Absent  Labs:  CMP Latest Ref Rng 03/12/2016 03/11/2016 03/07/2016  Glucose 65 - 99 mg/dL 96 742(V) 956(L)  BUN 6 - 20 mg/dL Creatinine 0.44 - 1.00 mg/dL 8.75 6.43 3.29  Sodium 135 - 145 mmol/L 139 137 137  Potassium 3.5 - 5.1 mmol/L 3.8 3.4(L) 3.5  Chloride 101 - 111 mmol/L 106 104 108  CO2 22 - 32 mmol/L 24 22 21(L)  Calcium 8.9 - 10.3 mg/dL 8.3(L) 8.7(L) 8.7(L)  Total Protein 6.5 - 8.1 g/dL 5.1(O) 6.1(L)  6.4(L)  Total Bilirubin 0.3 - 1.2 mg/dL 0.5 0.4 0.4  Alkaline Phos 38 - 126 U/L 198(H) 213(H) 211(H)  AST 15 - 41 U/L ALT 14 - 54 U/L hour urine protein 3833 Pr:Cr ratio 2.19   Imaging Studies:    No doscordant growth.  Final results pending.  BPP 8/8 and 8/8   Medications:  Scheduled . docusate sodium  100 mg Oral Daily  . labetalol  300 mg Oral Q8H  . pantoprazole  40 mg Oral Daily  . prenatal multivitamin  1 tablet Oral Q1200  . sodium chloride flush  3 mL Intravenous Q12H   I have reviewed the patient's current medications.  ASSESSMENT: [redacted]w[redacted]d Estimated Date of Delivery: 04/26/16  Patient Active Problem List   Diagnosis Date Noted  . Preeclampsia 03/11/2016  . Mild preeclampsia   . Pre-eclampsia 03/07/2016  . Dichorionic diamniotic twin pregnancy in first trimester 10/12/2015  . Rh negative status during pregnancy in first trimester, antepartum 10/12/2015  . Asymptomatic bacteriuria during pregnancy in first trimester 10/12/2015  . History of LEEP (loop electrosurgical excision procedure) of cervix complicating pregnancy in first trimester 09/25/2015  . AMA (advanced maternal age) multigravida 35+ 09/25/2015  . Supervision of other high risk pregnancies, first trimester 09/14/2015  . Depression 01/01/2015  . Mass of breast, left 08/30/2013  . Family history of DVT 08/20/2013  . Concentration deficit 08/12/2013  . GERD (gastroesophageal reflux disease)  01/22/2013  . Obesity 01/22/2013  . Elevated BP 01/22/2013  . History of gallstones 01/08/2013  . Alternating constipation and diarrhea 01/08/2013  . Abdominal pain, chronic, epigastric 01/08/2013    PLAN: Labetolol dosage increased to 300mg  tid Continue inpt observation for now Continue routine antenatal care. ECHO normal   Makiyah Zentz H 03/14/2016,7:56 AM

## 2016-03-15 ENCOUNTER — Inpatient Hospital Stay (HOSPITAL_COMMUNITY): Payer: BLUE CROSS/BLUE SHIELD | Admitting: Anesthesiology

## 2016-03-15 LAB — TYPE AND SCREEN
ABO/RH(D): O NEG
Antibody Screen: NEGATIVE

## 2016-03-15 LAB — COMPREHENSIVE METABOLIC PANEL
ALBUMIN: 2.5 g/dL — AB (ref 3.5–5.0)
ALK PHOS: 235 U/L — AB (ref 38–126)
ALT: 17 U/L (ref 14–54)
ANION GAP: 9 (ref 5–15)
AST: 23 U/L (ref 15–41)
BILIRUBIN TOTAL: 0.2 mg/dL — AB (ref 0.3–1.2)
BUN: 10 mg/dL (ref 6–20)
CALCIUM: 8.3 mg/dL — AB (ref 8.9–10.3)
CO2: 24 mmol/L (ref 22–32)
CREATININE: 0.58 mg/dL (ref 0.44–1.00)
Chloride: 103 mmol/L (ref 101–111)
GFR calc non Af Amer: >60 mL/min (ref >60)
GLUCOSE: 107 mg/dL — AB (ref 65–99)
Potassium: 4.2 mmol/L (ref 3.5–5.1)
Sodium: 136 mmol/L (ref 135–145)
TOTAL PROTEIN: 5.8 g/dL — AB (ref 6.5–8.1)

## 2016-03-15 LAB — CBC
HEMATOCRIT: 30.9 % — AB (ref 36.0–46.0)
HEMOGLOBIN: 9.9 g/dL — AB (ref 12.0–15.0)
MCH: 29.8 pg (ref 26.0–34.0)
MCHC: 32 g/dL (ref 30.0–36.0)
MCV: 93.1 fL (ref 78.0–100.0)
Platelets: 295 10*3/uL (ref 150–400)
RBC: 3.32 MIL/uL — ABNORMAL LOW (ref 3.87–5.11)
RDW: 16.8 % — ABNORMAL HIGH (ref 11.5–15.5)
WBC: 10.4 10*3/uL (ref 4.0–10.5)

## 2016-03-15 MED ORDER — OXYCODONE-ACETAMINOPHEN 5-325 MG PO TABS: 1.0000 | ORAL_TABLET | ORAL | Status: DC | PRN

## 2016-03-15 MED ORDER — MISOPROSTOL 200 MCG PO TABS
ORAL_TABLET | ORAL | Status: AC
  Filled 2016-03-15: qty 3

## 2016-03-15 MED ORDER — OXYTOCIN BOLUS FROM INFUSION
500.0000 mL | INTRAVENOUS | Status: DC
  Administered 2016-03-15: 500 mL via INTRAVENOUS

## 2016-03-15 MED ORDER — OXYTOCIN 40 UNITS IN LACTATED RINGERS INFUSION - SIMPLE MED: 2.5000 [IU]/h | INTRAVENOUS | Status: DC

## 2016-03-15 MED ORDER — LIDOCAINE HCL (PF) 1 % IJ SOLN
INTRAMUSCULAR | Status: DC | PRN
  Administered 2016-03-15: 2 mL via EPIDURAL
  Administered 2016-03-15 (×2): 4 mL via EPIDURAL

## 2016-03-15 MED ORDER — PHENYLEPHRINE 40 MCG/ML (10ML) SYRINGE FOR IV PUSH (FOR BLOOD PRESSURE SUPPORT): 80.0000 ug | PREFILLED_SYRINGE | INTRAVENOUS | Status: DC | PRN

## 2016-03-15 MED ORDER — ONDANSETRON HCL 4 MG/2ML IJ SOLN: 4.0000 mg | Freq: Four times a day (QID) | INTRAMUSCULAR | Status: DC | PRN

## 2016-03-15 MED ORDER — LIDOCAINE HCL (PF) 1 % IJ SOLN
30.0000 mL | INTRAMUSCULAR | Status: DC | PRN
  Filled 2016-03-15: qty 30

## 2016-03-15 MED ORDER — OXYTOCIN 40 UNITS IN LACTATED RINGERS INFUSION - SIMPLE MED
1.0000 m[IU]/min | INTRAVENOUS | Status: DC
  Administered 2016-03-15: 2 m[IU]/min via INTRAVENOUS
  Filled 2016-03-15: qty 1000

## 2016-03-15 MED ORDER — ACETAMINOPHEN 325 MG PO TABS
650.0000 mg | ORAL_TABLET | ORAL | Status: DC | PRN
  Administered 2016-03-15 (×2): 650 mg via ORAL
  Filled 2016-03-15: qty 2

## 2016-03-15 MED ORDER — MISOPROSTOL 25 MCG QUARTER TABLET
25.0000 ug | ORAL_TABLET | ORAL | Status: DC | PRN
  Administered 2016-03-15 (×2): 25 ug via VAGINAL
  Filled 2016-03-15 (×3): qty 0.25

## 2016-03-15 MED ORDER — MAGNESIUM SULFATE BOLUS VIA INFUSION
4.0000 g | Freq: Once | INTRAVENOUS | Status: AC
  Administered 2016-03-15: 4 g via INTRAVENOUS
  Filled 2016-03-15: qty 500

## 2016-03-15 MED ORDER — LACTATED RINGERS IV SOLN: 500.0000 mL | INTRAVENOUS | Status: DC | PRN

## 2016-03-15 MED ORDER — DIPHENHYDRAMINE HCL 50 MG/ML IJ SOLN: 12.5000 mg | INTRAMUSCULAR | Status: DC | PRN

## 2016-03-15 MED ORDER — TERBUTALINE SULFATE 1 MG/ML IJ SOLN: 0.2500 mg | Freq: Once | INTRAMUSCULAR | Status: DC | PRN

## 2016-03-15 MED ORDER — PHENYLEPHRINE 40 MCG/ML (10ML) SYRINGE FOR IV PUSH (FOR BLOOD PRESSURE SUPPORT)
80.0000 ug | PREFILLED_SYRINGE | INTRAVENOUS | Status: DC | PRN
  Filled 2016-03-15: qty 10

## 2016-03-15 MED ORDER — FENTANYL CITRATE (PF) 100 MCG/2ML IJ SOLN
100.0000 ug | INTRAMUSCULAR | Status: DC | PRN
  Administered 2016-03-15: 100 ug via INTRAVENOUS
  Filled 2016-03-15: qty 2

## 2016-03-15 MED ORDER — EPHEDRINE 5 MG/ML INJ: 10.0000 mg | INTRAVENOUS | Status: DC | PRN

## 2016-03-15 MED ORDER — FENTANYL 2.5 MCG/ML BUPIVACAINE 1/10 % EPIDURAL INFUSION (WH - ANES)
14.0000 mL/h | INTRAMUSCULAR | Status: DC | PRN
  Administered 2016-03-15: 14 mL/h via EPIDURAL
  Filled 2016-03-15: qty 125

## 2016-03-15 MED ORDER — CITRIC ACID-SODIUM CITRATE 334-500 MG/5ML PO SOLN: 30.0000 mL | ORAL | Status: DC | PRN

## 2016-03-15 MED ORDER — LACTATED RINGERS IV SOLN
2.0000 g/h | INTRAVENOUS | Status: DC
Start: 2016-03-15 — End: 2016-03-16
  Administered 2016-03-15: 2 g/h via INTRAVENOUS
  Filled 2016-03-15: qty 80

## 2016-03-15 MED ORDER — OXYCODONE-ACETAMINOPHEN 5-325 MG PO TABS: 2.0000 | ORAL_TABLET | ORAL | Status: DC | PRN

## 2016-03-15 MED ORDER — BUPIVACAINE HCL (PF) 0.25 % IJ SOLN
INTRAMUSCULAR | Status: DC | PRN
  Administered 2016-03-15: 8 mL via EPIDURAL
  Administered 2016-03-15: 4 mL via EPIDURAL

## 2016-03-15 MED ORDER — LACTATED RINGERS IV SOLN: 500.0000 mL | Freq: Once | INTRAVENOUS | Status: DC

## 2016-03-15 MED ORDER — LACTATED RINGERS IV SOLN
INTRAVENOUS | Status: DC
  Administered 2016-03-15 (×3): via INTRAVENOUS

## 2016-03-15 NOTE — Anesthesia Procedure Notes (Signed)
Epidural Patient location during procedure: OB  Staffing Anesthesiologist: Mykel Sponaugle Performed by: anesthesiologist   Preanesthetic Checklist Completed: patient identified, site marked, surgical consent, pre-op evaluation, timeout performed, IV checked, risks and benefits discussed and monitors and equipment checked  Epidural Patient position: sitting Prep: site prepped and draped and DuraPrep Patient monitoring: continuous pulse ox and blood pressure Approach: midline Location: L3-L4 Injection technique: LOR saline  Needle:  Needle type: Tuohy  Needle gauge: 17 G Needle length: 9 cm and 9 Needle insertion depth: 7 cm Catheter type: closed end flexible Catheter size: 19 Gauge Catheter at skin depth: 13 cm Test dose: negative  Assessment Sensory level: T8 Events: blood not aspirated, injection not painful, no injection resistance, negative IV test and no paresthesia  Additional Notes Patient identified. Risks/Benefits/Options discussed with patient including but not limited to bleeding, infection, nerve damage, paralysis, failed block, incomplete pain control, headache, blood pressure changes, nausea, vomiting, reactions to medications, itching and postpartum back pain. Confirmed with bedside nurse the patient's most recent platelet count. Confirmed with patient that they are not currently taking any anticoagulation, have any bleeding history or any family history of bleeding disorders. Patient expressed understanding and wished to proceed. All questions were answered. Sterile technique was used throughout the entire procedure. Please see nursing notes for vital signs. Test dose was given through epidural catheter and negative prior to continuing to dose epidural or start infusion. Warning signs of high block given to the patient including shortness of breath, tingling/numbness in hands, complete motor block, or any concerning symptoms with instructions to call for help. Patient  was given instructions on fall risk and not to get out of bed. All questions and concerns addressed with instructions to call with any issues or inadequate analgesia.

## 2016-03-15 NOTE — Progress Notes (Signed)
Labor Progress Note Judithann SheenMartha L Grafton is a 35 y.o. G3P1011 at 8128w0d by L/8 c/b di-di gestation, AMA, rh negative status, and obesity who presented with preeclampsia with severe features, now doing well in latent induced labor.   S:  Doing well, no complaints. FB out.  O:  BP 123/63 mmHg  Pulse 75  Temp(Src) 97.3 F (36.3 C) (Oral)  Resp 18  Ht 5\' 10"  (1.778 m)  Wt 315 lb 9.6 oz (143.155 kg)  BMI 45.28 kg/m2  SpO2 97%  LMP 07/21/2015 EFM:  Baby a: 135/mod/+accels, no decels Baby b: 130/mod/+a, no decels  CVE: Dilation: 5 Effacement (%): 60 Cervical Position: Posterior Station: -3 Presentation: Vertex Exam by:: Davis,RN  @1815 , deferred currently   Exam: Lungs clear b/l +1 bicep reflexes b/l +1 pitting edema to lower abdomen  A&P: 35 y.o. G3P1011 4328w0d by L/8 c/b di-di gestation, AMA, rh negative status and obesity who p/w preeclampsia with severe features, doing well in latent induced labor.  #Preeclampsia with severe features: stable w mild range BP in last 4h. Initial dx based upon UPC 2.18 with severe range pressures, abdominal pain and new HA. Currently, cardiopulm exam nl. UOP appropraite and HELLP panel stable. Plan as follows: -mag gtt 2g/h -tylenol 650mg  q4h prn HA -labetalol 300mg  q8h -q1h VS and strict I/O -ambulate as tolerated  #Labor: latent labor and labor curve is appropriate w favorable cervix so will start pitocin -start pitocin gtt and titrate per protocol  #Pain: tylenol as outlined above and pt desires epidural when in active labor.  -Anesthesia consult  #FWB: category 1 strip for baby A and B.  -continue EFM  #GBS: neg  #RH negative status: blood type O neg -rhogam postpartum  #PPH Risk: medium given multiple gestation, obesity, hct <30 upon admission and magnesium therapy. As such, updated type and screen today, notified nursing, anticipate cytotec in the room at time of delivery and will monitor closely for PPH. Would favor hemabate  over methergine should pt have require pharm therapy for PPH.  #Postpartum care: -feeding method: breast -contraception: BTL -vaccines: rubella immune, flu UTD, tdap UTD, HPV not indicated, will verify varicella history  Marina Goodellaroline Tranquilino Fischler, MD 7:10 PM

## 2016-03-15 NOTE — Progress Notes (Signed)
Katherine [name] is a 35 y.o. G3P1011 at 2319w0d admitted for severe preeclampsia  Subjective: Patient having a few severe range BPs.  Headache has continued.  Will move patient to L&D and induce for severe preeclampsia.  Babies are confirmed vtx/breech by bedside US.   Objective: BP 131/73 mmHg  Pulse 82  Temp(Src) 98.7 F (37.1 C) (Oral)  Resp 18  Ht 5\' 10"  (1.778 m)  Wt 315 lb 9.6 oz (143.155 kg)  BMI 45.28 kg/m2  SpO2 97%  LMP 07/21/2015 I/O last 3 completed shifts: In: 3 [I.V.:3] Out: -  Total I/O In: 3 [I.V.:3] Out: -   FHT:  FHR: 145/150 bpm, variability: moderate,  accelerations:  Present,  decelerations:  Absent UC:   none SVE:      Labs: Lab Results  Component Value Date   WBC 10.3 03/12/2016   HGB 9.3* 03/12/2016   HCT 28.8* 03/12/2016   MCV 93.5 03/12/2016   PLT 262 03/12/2016    Assessment / Plan: Induction of labor, likely with cytotec.  Start magnesium. GBS neg   Khian Remo JEHIEL 03/15/2016, 6:57 AM

## 2016-03-15 NOTE — Progress Notes (Signed)
Patient ID: Katherine [name], female   DOB: 12-29-1980, [age] y.o.   MRN: [id number] Katherine SheenMartha L [name] is a 35 y.o. G3P1011 at 9145w0d.  Subjective: Denies contractions. Mild HA.   Objective: BP 129/64 mmHg  Pulse 73  Temp(Src) 97.3 F (36.3 C) (Oral)  Resp 16  Ht 5\' 10"  (1.778 m)  Wt 315 lb 9.6 oz (143.155 kg)  BMI 45.28 kg/m2  SpO2 97%  LMP 07/21/2015   FHT:   Baby A: FHR: 145 bpm, variability: mod,  accelerations:  15x15,  decelerations:  none  Baby B: FHR 155 bpm, variablility: mod, accels: 15x15, decels,: none. RN adjusting. Difficult to trace, active baby.  UC:  UI  Dilation: Closed Effacement (%): Thick Cervical Position: Posterior Station: Ballotable Presentation: Vertex Exam by:: Katrinka BlazingSmith, CNM  Vtx/Br Verified by informal BS US.   Labs: No results found for this or any previous visit (from the past 24 hour(s)).  Assessment / Plan: 5445w0d week IUP Labor: IOL for Pre-E w/ severe features, on Mag Sulfate.  Di/di Twin gestation  Fetal Wellbeing:  Category I Pain Control:  None Anticipated MOD:  SVD. Lengthy discussion w/ pt about IOL methods, plan for vaginal delivery, but possibility of needing C/S for baby B. Location of birth depends of attending's preference at time of second stage.  Cytotec placed.   [place]pe[place]ia Hana Trippett, CNM 03/15/2016 9:39 AM

## 2016-03-15 NOTE — Progress Notes (Addendum)
Labor Progress Note Judithann SheenMartha L Blacksher is a 35 y.o. G3P1011 at 4844w0d by L/8 c/b AMA, rh negative status and di-di gestation who presented with preeclampsia with severe features, now doing well s/p cytotec for induction.   S:  Doing well, mild HA improved w tylenol. No visual sx. Light contractions. Urinating normally. No complaints.  O:  BP 149/83 mmHg  Pulse 82  Temp(Src) 97.3 F (36.3 C) (Oral)  Resp 18  Ht 5\' 10"  (1.778 m)  Wt 315 lb 9.6 oz (143.155 kg)  BMI 45.28 kg/m2  SpO2 97%  LMP 07/21/2015 EFM:  Baby a: 145/mod/+accels, no decels Baby b: 140/mod/+a, no decels  CVE: Dilation: 1 Effacement (%): 50 Cervical Position: Posterior Station: Ballotable Presentation: Vertex Exam by:: A.Davis,RN By RN @ 1345  Exam: Lungs clear b/l +1 bicep reflexes b/l +1 pitting edema to lower abdomen  A&P: 35 y.o. G3P1011 3644w0d by L/8 c/b AMA, rh negative status di-di gestation who p/w preeclampsia with severe features, doing well in latent induced labor.  #Preeclampsia with severe features: stable w mild range BP. Initial dx based upon UPC 2.18 with severe range pressures, abdominal pain and new HA. Currently, cardiopulm exam nl. UOP dec in last hr (25cc/h) but pt is urinating normally now so will follow closely and if UOP < 30cc/h, will place foley catheter for improved monitoring of I/O. For now will continue mag, labetalol, q1h RN checks for mag toxicity. Will continue w induction as outlined below. -mag 2g/h -tylenol 650mg  q4h prn HA -labetalol 300mg  q8h -q1h VS and strict I/O -ambulate as tolerated  #Labor: latent labor and labor curve is appropriate. Will continue w induction w cytotec and FB. Anticipate recheck in 4h after last cytotec dosing -will place FB -continue cytotec q4h  #Pain: tylenol as outlined above and pt desires epidural when in active labor. Anesthesia consult.  #FWB: category 1 strip for baby A and B.  -continue EFM  #GBS: neg  #RH negative status:  blood type O neg -rhogam postpartum  #Postpartum care: -feeding method: will verify -contraception: will verify -vaccines: rubella immune, flu UTD, tdap UTD, HPV not indicated, will verify varicella history  Marina Goodellaroline Mullin, MD 2:47 PM  I rechecked foley bulb placement after resident and agree with above. CBC, CMET and Type and screen repeated. Discussed w/ pt that [facility] to to high PPH risk.   [place]um[place]ia Kaipo Ardis, CNM 03/15/2016 4:41 PM

## 2016-03-15 NOTE — Progress Notes (Signed)
Patient ID: Katherine [name], female   DOB: 06/23/1981, 35 y.o.   MRN: [id number] Labor Progress Note Katherine [name] is a 35 y.o. G3P1011 at 7859w0d presented for preeclampsia  S:  feeling contractions despite epidural.   O:  BP 125/78 mmHg  Pulse 71  Temp(Src) 97.9 F (36.6 C) (Oral)  Resp 18  Ht 5\' 10"  (1.778 m)  Wt 315 lb 9.6 oz (143.155 kg)  BMI 45.28 kg/m2  SpO2 96%  LMP 07/21/2015 EFM:  Twin A 130/mod/+accels, no decels Twin B 130/mod/no accels, no decels  CVE: Dilation: Lip/rim Effacement (%): 90 Cervical Position: Middle Station: -1, 0 Presentation: Vertex Exam by:: LCarpenter,Rn  AROM Twin A- Clear Placed FSE Placed IUPC  A&P: 35 y.o. G3P1011 8459w0d her for IOL due to preeclampsia (elevated BP, HA, UPC 2.18), Di-Di twins (condordant, ~5# each)  #Labor: Progressing well on pitocin and is now s/p cytotec and foley bulb. Twin A-Vtx, Twin B- Breech. Plan for delivery in OR #Pain: Epidural #FWB: Cat I x2 #GBS: neg #Preeclampsia: On magnesium infusion. Continue until 24 hr PP  Federico FlakeKimberly Niles Daneesha Quinteros, MD 10:05 PM

## 2016-03-15 NOTE — Anesthesia Pain Management Evaluation Note (Signed)
  CRNA Pain Management Visit Note  Patient: Katherine [name], 35 y.o., female  "Hello I am a member of the anesthesia team at [facility]. We have an anesthesia team available at all times to provide care throughout the hospital, including epidural management and anesthesia for C-section. I don't know your plan for the delivery whether it a natural birth, water birth, IV sedation, nitrous supplementation, doula or epidural, but we want to meet your pain goals."   1.Was your pain managed to your expectations on prior hospitalizations?   Yes   2.What is your expectation for pain management during this hospitalization?     Epidural  3.How can we help you reach that goal? epidural  Record the patient's initial score and the patient's pain goal.   Pain: 1  Pain Goal: 5 [facility] wants you to be able to say your pain was always managed very well.  Katherine [name],Katherine [name] 03/15/2016

## 2016-03-15 NOTE — Anesthesia Preprocedure Evaluation (Signed)
Anesthesia Evaluation  Patient identified by MRN, date of birth, ID band Patient awake    Reviewed: Allergy & Precautions, H&P , NPO status , Patient's Chart, lab work & pertinent test results, reviewed documented beta blocker date and time   Airway Mallampati: II  TM Distance: >3 FB Neck ROM: full    Dental no notable dental hx.    Pulmonary neg pulmonary ROS, former smoker,    Pulmonary exam normal breath sounds clear to auscultation       Cardiovascular hypertension, Normal cardiovascular exam Rhythm:regular Rate:Normal     Neuro/Psych negative neurological ROS  negative psych ROS   GI/Hepatic Neg liver ROS, GERD  ,  Endo/Other  Morbid obesity  Renal/GU negative Renal ROS  negative genitourinary   Musculoskeletal   Abdominal   Peds  Hematology negative hematology ROS (+)   Anesthesia Other Findings Pregnancy - HTN and twins Platelets and allergies reviewed Denies active cardiac or pulmonary symptoms, METS > 4  Denies blood thinning medications, bleeding disorders,  asthma, supine hypotension syndrome, previous anesthesia difficulties    Reproductive/Obstetrics (+) Pregnancy                             Anesthesia Physical Anesthesia Plan  ASA: III  Anesthesia Plan: Epidural   Post-op Pain Management:    Induction:   Airway Management Planned:   Additional Equipment:   Intra-op Plan:   Post-operative Plan:   Informed Consent: I have reviewed the patients History and Physical, chart, labs and discussed the procedure including the risks, benefits and alternatives for the proposed anesthesia with the patient or authorized representative who has indicated his/her understanding and acceptance.     Plan Discussed with:   Anesthesia Plan Comments:         Anesthesia Quick Evaluation

## 2016-03-16 ENCOUNTER — Encounter (HOSPITAL_COMMUNITY): Payer: Self-pay | Admitting: Neonatology

## 2016-03-16 DIAGNOSIS — O328XX2 Maternal care for other malpresentation of fetus, fetus 2: Secondary | ICD-10-CM

## 2016-03-16 DIAGNOSIS — O321XX0 Maternal care for breech presentation, not applicable or unspecified: Secondary | ICD-10-CM | POA: Diagnosis not present

## 2016-03-16 DIAGNOSIS — O30043 Twin pregnancy, dichorionic/diamniotic, third trimester: Secondary | ICD-10-CM

## 2016-03-16 DIAGNOSIS — O1414 Severe pre-eclampsia complicating childbirth: Secondary | ICD-10-CM

## 2016-03-16 DIAGNOSIS — Z3A34 34 weeks gestation of pregnancy: Secondary | ICD-10-CM

## 2016-03-16 LAB — CBC
HEMATOCRIT: 33.2 % — AB (ref 36.0–46.0)
HEMOGLOBIN: 10.6 g/dL — AB (ref 12.0–15.0)
MCH: 29.5 pg (ref 26.0–34.0)
MCHC: 31.9 g/dL (ref 30.0–36.0)
MCV: 92.5 fL (ref 78.0–100.0)
Platelets: 308 10*3/uL (ref 150–400)
RBC: 3.59 MIL/uL — ABNORMAL LOW (ref 3.87–5.11)
RDW: 16.9 % — AB (ref 11.5–15.5)
WBC: 15.9 10*3/uL — AB (ref 4.0–10.5)

## 2016-03-16 MED ORDER — WITCH HAZEL-GLYCERIN EX PADS: 1.0000 "application " | MEDICATED_PAD | CUTANEOUS | Status: DC | PRN

## 2016-03-16 MED ORDER — COCONUT OIL OIL: 1.0000 "application " | TOPICAL_OIL | Status: DC | PRN

## 2016-03-16 MED ORDER — PRENATAL MULTIVITAMIN CH: 1.0000 | ORAL_TABLET | Freq: Every day | ORAL | Status: DC

## 2016-03-16 MED ORDER — MAGNESIUM SULFATE 50 % IJ SOLN
2.0000 g/h | INTRAVENOUS | Status: AC
  Administered 2016-03-16: 2 g/h via INTRAVENOUS
  Filled 2016-03-16: qty 80

## 2016-03-16 MED ORDER — FAMOTIDINE 20 MG PO TABS
40.0000 mg | ORAL_TABLET | Freq: Once | ORAL | Status: AC
  Administered 2016-03-16: 40 mg via ORAL
  Filled 2016-03-16: qty 2

## 2016-03-16 MED ORDER — BENZOCAINE-MENTHOL 20-0.5 % EX AERO
1.0000 "application " | INHALATION_SPRAY | CUTANEOUS | Status: DC | PRN
  Administered 2016-03-16: 1 via TOPICAL
  Filled 2016-03-16: qty 56

## 2016-03-16 MED ORDER — OXYCODONE-ACETAMINOPHEN 5-325 MG PO TABS: 2.0000 | ORAL_TABLET | ORAL | Status: DC | PRN

## 2016-03-16 MED ORDER — SODIUM CHLORIDE 0.9 % IV SOLN
INTRAVENOUS | Status: AC
  Administered 2016-03-16: via INTRAVENOUS

## 2016-03-16 MED ORDER — LACTATED RINGERS IV SOLN
INTRAVENOUS | Status: DC
  Administered 2016-03-16 (×2): via INTRAVENOUS

## 2016-03-16 MED ORDER — MISOPROSTOL 200 MCG PO TABS
600.0000 ug | ORAL_TABLET | Freq: Once | ORAL | Status: AC
  Administered 2016-03-16: 600 ug via BUCCAL

## 2016-03-16 MED ORDER — ACETAMINOPHEN 325 MG PO TABS: 650.0000 mg | ORAL_TABLET | ORAL | Status: DC | PRN

## 2016-03-16 MED ORDER — METOCLOPRAMIDE HCL 10 MG PO TABS
10.0000 mg | ORAL_TABLET | Freq: Once | ORAL | Status: AC
  Administered 2016-03-16: 10 mg via ORAL
  Filled 2016-03-16: qty 1

## 2016-03-16 MED ORDER — DOCUSATE SODIUM 100 MG PO CAPS
100.0000 mg | ORAL_CAPSULE | Freq: Two times a day (BID) | ORAL | Status: DC
  Administered 2016-03-16 – 2016-03-17 (×2): 100 mg via ORAL
  Filled 2016-03-16 (×2): qty 1

## 2016-03-16 MED ORDER — DIBUCAINE 1 % RE OINT: 1.0000 "application " | TOPICAL_OINTMENT | RECTAL | Status: DC | PRN

## 2016-03-16 MED ORDER — OXYCODONE-ACETAMINOPHEN 5-325 MG PO TABS: 1.0000 | ORAL_TABLET | ORAL | Status: DC | PRN

## 2016-03-16 MED ORDER — DIPHENHYDRAMINE HCL 25 MG PO CAPS
25.0000 mg | ORAL_CAPSULE | Freq: Four times a day (QID) | ORAL | Status: DC | PRN
Start: 2016-03-16 — End: 2016-03-17

## 2016-03-16 MED ORDER — ONDANSETRON HCL 4 MG/2ML IJ SOLN: 4.0000 mg | INTRAMUSCULAR | Status: DC | PRN

## 2016-03-16 MED ORDER — SIMETHICONE 80 MG PO CHEW: 80.0000 mg | CHEWABLE_TABLET | ORAL | Status: DC | PRN

## 2016-03-16 MED ORDER — ONDANSETRON HCL 4 MG PO TABS: 4.0000 mg | ORAL_TABLET | ORAL | Status: DC | PRN

## 2016-03-16 MED ORDER — LACTATED RINGERS IV SOLN
INTRAVENOUS | Status: DC
  Administered 2016-03-17: 20 mL/h via INTRAVENOUS

## 2016-03-16 MED ORDER — IBUPROFEN 600 MG PO TABS
600.0000 mg | ORAL_TABLET | Freq: Four times a day (QID) | ORAL | Status: DC
  Administered 2016-03-16 – 2016-03-17 (×6): 600 mg via ORAL
  Filled 2016-03-16 (×6): qty 1

## 2016-03-16 NOTE — Anesthesia Postprocedure Evaluation (Signed)
Anesthesia Post Note  Patient: Katherine [name]  Procedure(s) Performed: * No procedures listed *  Patient location during evaluation: Mother Baby Anesthesia Type: Epidural Level of consciousness: awake and alert and oriented Pain management: pain level controlled Vital Signs Assessment: post-procedure vital signs reviewed and stable Respiratory status: spontaneous breathing and nonlabored ventilation Cardiovascular status: stable Postop Assessment: no headache, no backache, patient able to bend at knees, no signs of nausea or vomiting and adequate PO intake Anesthetic complications: no     Last Vitals:  Filed Vitals:   03/16/16 0620 03/16/16 0814  BP:  132/51  Pulse: 80 74  Temp:  36.6 C  Resp: 20 18    Last Pain:  Filed Vitals:   03/16/16 0925  PainSc: 0-No pain   Pain Goal: Patients Stated Pain Goal: 3 (03/16/16 0815)               Madison HickmanGREGORY,Beckem Tomberlin

## 2016-03-16 NOTE — Lactation Note (Signed)
This note was copied from a baby's chart. Lactation Consultation Note  Initial visit made.  Providing Breastmilk For Your Baby in NICU given and reviewed.  Mom states she desires to both breast and formula feed.  She pumped this AM and small amount of colostrum visible.  Discussed colostrum and milk coming to volume.  Stressed importance of pumping every 2-3 hours during the day and every 4-5 hours and night.  Instructed on initiation setting and hand expression.  Encouraged to call with concerns/assist prn.  Patient Name: Katherine NajjarBoyA Shantoria Auerbach UEAVW'UToday's Date: 03/16/2016 Reason for consult: Initial assessment;NICU baby;Multiple gestation   Maternal Data    Feeding    LATCH Score/Interventions                      Lactation Tools Discussed/Used WIC Program: No Initiated by:: RN Date initiated:: 03/16/16   Consult Status Consult Status: Follow-up Date: 03/17/16 Follow-up type: In-patient    Huston FoleyMOULDEN, Raed Schalk S 03/16/2016, 10:22 AM

## 2016-03-16 NOTE — Progress Notes (Signed)
Pt shaking, bp not reading correctly

## 2016-03-16 NOTE — Consult Note (Signed)
Neonatology Note:   Attendance at C-section:    I was asked by Dr. Alvester MorinNewton to attend this vaginal delivery of concordant didi twins at 6034 0/[redacted] wk EGA. The mother is a G1011, GBS negative with good prenatal care. ROM 2 hours before delivery, fluid clear.  Delayed cord clamping for both completed. Infant A vigorous with good spontaneous cry and tone. Needed only minimal bulb suctioning. Lungs clear. Apgars 8/9. Infant B vigorous with good spontaneous cry though not sustained respiratory effort.  Lung sounds bilateral and diminished at bases.  CPAP 6cm for recruitment with good response.  Apgars 7/9. Parents updated and father accompanied infants to NICU due to prematurity.  Twin B transported off CPAP with slow gradual development of grunting thus placed on CPAP on arrival in NICU.   Katherine Kidavid C. Leary RocaEhrmann, MD

## 2016-03-16 NOTE — Progress Notes (Addendum)
Post Partum Day 1  Subjective:  Katherine [name] is a 35 y.o. [id number] 2533w0d s/p NSVD of di-di twins c/b IOL 2/2 preE w severe features.  No acute events overnight.  Pt denies problems with ambulating, voiding or po intake.  She denies nausea or vomiting.  Pain is well controlled.  She has had flatus.  Lochia Minimal.  Plan for birth control is IUD - paraguard in addition to vasectomy.  Method of Feeding: breast.  Objective: Blood pressure 129/50, pulse 70, temperature 97.8 F (36.6 C), temperature source Oral, resp. rate 18, height 5\' 10"  (1.778 m), weight 301 lb 4 oz (136.646 kg), last menstrual period 07/21/2015, SpO2 99 %, unknown if currently breastfeeding.  Physical Exam:  General: alert, cooperative and no distress Lochia:normal flow Chest: normal WOB Heart: Regular rate Abdomen: +BS, soft, mild TTP (appropriate) Uterine Fundus: firm, at umbilicus DVT Evaluation: No evidence of DVT seen on physical exam. Extremities: +1 edema to mid shins   Recent Labs  03/15/16 1656 03/16/16 0050  HGB 9.9* 10.6*  HCT 30.9* 33.2*    Assessment/Plan:  ASSESSMENT: Katherine [name] is a 35 y.o. G9F6213G3P1113 1933w0d s/p NSVD of di-di twins c/b preeclampsia with SF, doing well.  #Preeclampsia with severe features: stable, bps in mild range this morning after pain control immediately following delivery. Plan to hold labetalol which pt required prenatally and monitor pressures. Will continue mag for 24h pp while monitoring for mag toxicity.   #Contraception: plan for BTL tomorrow. -NPO at midnight  Plan for discharge tomorrow Continue routine PP care Breastfeeding support PRN   LOS: 5 days   Marina Goodellaroline Mullin  OB fellow attestation:  I have seen and examined this patient; I agree with above documentation in the resident's note.   Federico FlakeKimberly Niles Saryah Loper, MD 1:42 PM

## 2016-03-17 ENCOUNTER — Encounter (HOSPITAL_COMMUNITY): Payer: Self-pay | Admitting: Anesthesiology

## 2016-03-17 ENCOUNTER — Encounter (HOSPITAL_COMMUNITY)
Admission: AD | Disposition: A | Discharge status: Home / Self Care | Payer: Self-pay | Source: Ambulatory Visit | Attending: Obstetrics & Gynecology

## 2016-03-17 LAB — CBC
HCT: 28.6 % — ABNORMAL LOW (ref 36.0–46.0)
Hemoglobin: 8.9 g/dL — ABNORMAL LOW (ref 12.0–15.0)
MCH: 29.9 pg (ref 26.0–34.0)
MCHC: 31.1 g/dL (ref 30.0–36.0)
MCV: 96 fL (ref 78.0–100.0)
PLATELETS: 261 10*3/uL (ref 150–400)
RBC: 2.98 MIL/uL — AB (ref 3.87–5.11)
RDW: 17.3 % — ABNORMAL HIGH (ref 11.5–15.5)
WBC: 8 10*3/uL (ref 4.0–10.5)

## 2016-03-17 LAB — RPR: RPR: NONREACTIVE

## 2016-03-17 SURGERY — LIGATION, FALLOPIAN TUBE, POSTPARTUM
Anesthesia: Spinal | Laterality: Bilateral

## 2016-03-17 MED ORDER — IBUPROFEN 600 MG PO TABS: 600.0000 mg | ORAL_TABLET | Freq: Four times a day (QID) | ORAL | Status: DC | PRN

## 2016-03-17 MED ORDER — ONDANSETRON HCL 4 MG/2ML IJ SOLN
INTRAMUSCULAR | Status: AC
  Filled 2016-03-17: qty 2

## 2016-03-17 MED ORDER — DOCUSATE SODIUM 100 MG PO CAPS
100.0000 mg | ORAL_CAPSULE | Freq: Every day | ORAL | Status: DC
Start: 2016-03-17 — End: 2016-04-26

## 2016-03-17 NOTE — Anesthesia Preprocedure Evaluation (Deleted)
Anesthesia Evaluation  Patient identified by MRN, date of birth, ID band Patient awake    Reviewed: Allergy & Precautions, NPO status , Patient's Chart, lab work & pertinent test results  Airway        Dental   Pulmonary former smoker,           Cardiovascular hypertension (Pre-eclampsia),      Neuro/Psych PSYCHIATRIC DISORDERS Depression negative neurological ROS     GI/Hepatic Neg liver ROS, GERD  ,  Endo/Other  negative endocrine ROS  Renal/GU negative Renal ROS     Musculoskeletal negative musculoskeletal ROS (+)   Abdominal   Peds  Hematology negative hematology ROS (+)   Anesthesia Other Findings Day of surgery medications reviewed with the patient.  Reproductive/Obstetrics Twin delivery 5/16                           Anesthesia Physical Anesthesia Plan  ASA:   Anesthesia Plan:    Post-op Pain Management:    Induction:   Airway Management Planned:   Additional Equipment:   Intra-op Plan:   Post-operative Plan:   Informed Consent:   Plan Discussed with:   Anesthesia Plan Comments: (Case cancelled in Preop)        Anesthesia Quick Evaluation                                  Anesthesia Evaluation  Patient identified by MRN, date of birth, ID band Patient awake    Reviewed: Allergy & Precautions, H&P , NPO status , Patient's Chart, lab work & pertinent test results, reviewed documented beta blocker date and time   Airway Mallampati: II  TM Distance: >3 FB Neck ROM: full    Dental no notable dental hx.    Pulmonary neg pulmonary ROS, former smoker,    Pulmonary exam normal breath sounds clear to auscultation       Cardiovascular hypertension, Normal cardiovascular exam Rhythm:regular Rate:Normal     Neuro/Psych negative neurological ROS  negative psych ROS   GI/Hepatic Neg liver ROS, GERD  ,  Endo/Other  Morbid obesity   Renal/GU negative Renal ROS  negative genitourinary   Musculoskeletal   Abdominal   Peds  Hematology negative hematology ROS (+)   Anesthesia Other Findings Pregnancy - HTN and twins Platelets and allergies reviewed Denies active cardiac or pulmonary symptoms, METS > 4  Denies blood thinning medications, bleeding disorders,  asthma, supine hypotension syndrome, previous anesthesia difficulties    Reproductive/Obstetrics (+) Pregnancy                             Anesthesia Physical Anesthesia Plan  ASA: III  Anesthesia Plan: Epidural   Post-op Pain Management:    Induction:   Airway Management Planned:   Additional Equipment:   Intra-op Plan:   Post-operative Plan:   Informed Consent: I have reviewed the patients History and Physical, chart, labs and discussed the procedure including the risks, benefits and alternatives for the proposed anesthesia with the patient or authorized representative who has indicated his/her understanding and acceptance.     Plan Discussed with:   Anesthesia Plan Comments:         Anesthesia Quick Evaluation

## 2016-03-17 NOTE — Progress Notes (Signed)
pts teaching complete    Ambulated out

## 2016-03-17 NOTE — Discharge Instructions (Signed)
While you were in the hospital you were diagnosed with preeclampsia. When you return home, please call to make an OB appointment in 1 week to check your blood pressure. If you have headaches, vision changes, or worsening right sided belly pain please call you doctor sooner.  Congratulations on the birth of your new babies. When you return home, please take care of yourself. Here are important things to look out for:  1. Pain - it is normal to have cramping in your abdomen or soreness around your bottom. Please continue to take motrin 600mg  every 6 hours as needed for cramping or pain or ibuprofen 800mg  every 8 hours as needed.  2. Fever - if you have a fever, common infections in women after having a baby include breast and uterine infections. Please call you OB doctor as soon as possible if you have a fever.  3. Mood - it is common to have postpartum depression. If you have worsening mood, feelings of depression, hurting yourself or hurting someone else, please let your OB doctor know as soon as possible.  4. Birth control - you mentioned that you would like to have your tubes tied. Please schedule an appointment in 4 weeks so that you can plan to have this done around 6 weeks after delivery.  5. Breastfeeding - continue to breastfeed your baby as much as your baby needs. Be sure to drink lots of fluids and stay well hydrated.  6. Constipation - it is common to have constipation after having a baby. We have prescribed a stool softener to take once per day. You can continue this medicine until your poops are soft and you have no pain with pooping. If you have constipation even after taking the medicine, be sure to drink lots of fluids and you can pick up miralax at a local pharmacy. If you have ongoing trouble with constipation, please let your doctor know.

## 2016-03-17 NOTE — Lactation Note (Signed)
This note was copied from a baby's chart. Lactation Consultation Note  Patient Name: Baltazar NajjarBoyA Ocia Bonenfant ZOXWR'UToday's Date: 03/17/2016 Reason for consult: Follow-up assessment;NICU baby;Late preterm infant;Multiple gestation;Infant < 6lbs Mom reports she is pumping but not receiving breast milk yet, has not been pumping consistently. LC stressed importance of pumping every 3 hours for 15-30 minutes to encourage milk production, prevent engorgement and protect milk supply. Mom reports she will get on a good schedule when she gets home. Discussed supplements to support milk production and milk supply. Suggested Lactation Support by Woody SellerGaia, lactation cookies. Mom reports she has DEBP for home use. Engorgement care reviewed if needed.  Advised of OP services. Mom reports when babies able to go to breast she will be calling for assist.   Maternal Data    Feeding Feeding Type: Formula Nipple Type: Slow - flow Length of feed: 30 min  LATCH Score/Interventions                      Lactation Tools Discussed/Used Tools: Pump Breast pump type: Double-Electric Breast Pump   Consult Status Consult Status: Complete Date: 03/17/16 Follow-up type: In-patient    Alfred LevinsGranger, Stevan Eberwein Ann 03/17/2016, 8:37 AM

## 2016-03-17 NOTE — Progress Notes (Signed)
Patient ID: Katherine [name], female   DOB: August 28, 1981, 35 y.o.   MRN: [id number] Discussed with patient PP BTL vs interval BTL.  Reviewed with pt that her incision would be variable in size with a PP BTL due to her BMI and the abdominal edema that she has.  The pt opts for a interval BTL. Psize an dLR rvtPatient desires surgical management with laparoscopic bilateral tubal ligation.  The risks of surgery were discussed in detail with the patient including but not limited to: bleeding which may require transfusion or reoperation; infection which may require prolonged hospitalization or re-hospitalization and antibiotic therapy; injury to bowel, bladder, ureters and major vessels or other surrounding organs; need for additional procedures including laparotomy; thromboembolic phenomenon, incisional problems and other postoperative or anesthesia complications.  Patient was told that the likelihood that her condition and symptoms will be treated effectively with this surgical management was very high; the postoperative expectations were also discussed in detail. The patient also understands the alternative treatment options which were discussed in full. All questions were answered.  She was told that she will be contacted by our surgical scheduler regarding the time and date of her surgery; routine preoperative instructions of having nothing to eat or drink after midnight on the day prior to surgery and also coming to the hospital 1 1/2 hours prior to her time of surgery were also emphasized.  She was told she may be called for a preoperative appointment about a week prior to surgery and will be given further preoperative instructions at that visit. Printed patient education handouts about the procedure were given to the patient to review at home.  Marvine Encalade L. Harraway-Smith, M.D., Evern CoreFACOG

## 2016-03-17 NOTE — Discharge Summary (Signed)
Obstetric Discharge Summary  Reason for Admission: induction of labor 2/2 preeclampsia w SF Prenatal Procedures: none Intrapartum Procedures: none Postpartum Procedures: none Complications-Operative and Postpartum: none Delivery note: At 11:33 PM a viable female was delivered via Vaginal, Spontaneous Delivery (Presentation:OA). APGAR: 8,9; weight 5 lb 6.1 oz (2440 g).  Placenta status: Intact, Spontaneous. Cord: 3 vessels with the following complications: . Cord pH: not collected  Anesthesia: Epidural  Episiotomy: None Lacerations: None Suture Repair: none Est. Blood Loss (mL): 300ml (  At 11:38 PM a viable female was delivered via Vaginal, Spontaneous Delivery (Presentation: Breech). APGAR:7 9; weight 4 lb 9.4 oz (2080 g).  Placenta status: Intact, notably smaller than placenta for Twin A, Spontaneous. Cord: with the following complications: . Cord pH: not collected Anesthesia: Epidural  Episiotomy: None Lacerations: None Suture Repair: none Est. Blood Loss (mL): 300ml (received cytotec prophylactic for pp trickle and initially boggy LUS)      Hospital Course:  Principal Problem:   NSVD (normal spontaneous vaginal delivery) Active Problems:   Obesity   Family history of DVT   Supervision of other high risk pregnancies, first trimester   History of LEEP (loop electrosurgical excision procedure) of cervix complicating pregnancy in first trimester   AMA (advanced maternal age) multigravida 35+   Dichorionic diamniotic twin pregnancy in first trimester   Rh negative status during pregnancy in first trimester, antepartum   Asymptomatic bacteriuria during pregnancy in first trimester   Preeclampsia   Breech delivery   Katherine [name] is a 35 y.o. [id number] s/p NSVD of di-di twins.  Patient was admitted for IOL 2/2 preeclampsia w SF. Her BP was controlled w labetalol, and she recevied intrapartum and postpartum magnesium. Her induction was uncomplicated and  postpartum her blood pressures were well controlled off meds. She was asx. Team discussed possibility of BTL immediately postpartum but case was deferred for 6 weeks given pts body habitus. The pt feels ready to go home and  will be discharged with outpatient follow-up.   Today: No acute events overnight.  Pt denies problems with ambulating, voiding or po intake.  She denies nausea or vomiting.  Pain is well controlled.  She has had flatus. She has not had bowel movement.  Lochia Minimal.  Plan for birth control is  BTL @ 6wk..  Method of Feeding: breast/bottle.  Physical Exam:  General: alert and cooperative Lochia: appropriate Uterine Fundus: firm Incision: none DVT Evaluation: No evidence of DVT seen on physical exam.  H/H: Lab Results  Component Value Date/Time   HGB 8.9* 03/17/2016 09:11 AM   HCT 28.6* 03/17/2016 09:11 AM    Discharge Diagnoses: Term Pregnancy-delivered, di-di twins  Discharge Information: Date: 03/17/2016 Activity: pelvic rest Diet: routine  Medications: PNV, Ibuprofen and Colace Breast feeding:  Pumping breast milk Condition: stable Instructions: refer to handout -see patient instructions Discharge to: home   Discharge Instructions    Activity as tolerated    Complete by:  As directed      Call MD for:  difficulty breathing, headache or visual disturbances    Complete by:  As directed      Call MD for:  persistant dizziness or light-headedness    Complete by:  As directed      Call MD for:  persistant nausea and vomiting    Complete by:  As directed      Call MD for:  redness, tenderness, or signs of infection (pain, swelling, redness, odor or green/yellow discharge around incision site)  Complete by:  As directed      Call MD for:  severe uncontrolled pain    Complete by:  As directed      Call MD for:  temperature >100.4    Complete by:  As directed      Diet general    Complete by:  As directed             Medication List    STOP  taking these medications        acetaminophen 500 MG tablet  Commonly known as:  TYLENOL     CONCEPT OB 130-92.4-1 MG Caps     labetalol 200 MG tablet  Commonly known as:  NORMODYNE     pantoprazole 40 MG tablet  Commonly known as:  PROTONIX      TAKE these medications        docusate sodium 100 MG capsule  Commonly known as:  COLACE  Take 1 capsule (100 mg total) by mouth daily.     ibuprofen 600 MG tablet  Commonly known as:  ADVIL,MOTRIN  Take 1 tablet (600 mg total) by mouth every 6 (six) hours as needed (pain).     INTEGRA F 125-1 MG Caps  Take 1 capsule by mouth daily.     Medical Compression Stockings Misc  1 Device by Does not apply route daily.     triamcinolone ointment 0.1 %  Commonly known as:  KENALOG  Apply 1 application topically 2 (two) times daily.           Follow-up Information    Follow up with OB provider. Schedule an appointment as soon as possible for a visit in 1 week.   Why:  blood pressure check      Marina Goodell ,MD 03/17/2016,9:47 AM  CNM attestation I have seen and examined this patient and agree with above documentation in the resident's note.   Katherine [name] is a 35 y.o. [phone] s/p SVD of twins.   Pain is well controlled.  Plan for birth control is bilateral tubal ligation@6wk .  Method of Feeding: breast  PE:  BP 149/68 mmHg  Pulse 64  Temp(Src) 98 F (36.7 C) (Oral)  Resp 16  Ht  (1.778 m)  Wt 134.151 kg (295 lb 12 oz)  BMI 42.44 kg/m2  SpO2 99%  LMP 07/21/2015  Breastfeeding? Unknown Fundus firm   Recent Labs  03/16/16 0050 03/17/16 0911  HGB 10.6* 8.9*  HCT 33.2* 28.6*     Plan: discharge today - postpartum care discussed - f/u clinic in 6 weeks for postpartum visit   Zilla Shartzer, CNM 3:20 PM

## 2016-03-18 ENCOUNTER — Encounter (HOSPITAL_COMMUNITY): Payer: Self-pay | Admitting: *Deleted

## 2016-03-18 ENCOUNTER — Ambulatory Visit (HOSPITAL_COMMUNITY): Admission: RE | Admit: 2016-03-18 | Payer: BLUE CROSS/BLUE SHIELD | Source: Ambulatory Visit

## 2016-03-18 ENCOUNTER — Encounter (HOSPITAL_COMMUNITY): Payer: Self-pay

## 2016-03-21 ENCOUNTER — Telehealth: Payer: Self-pay | Admitting: *Deleted

## 2016-03-21 DIAGNOSIS — O1002 Pre-existing essential hypertension complicating childbirth: Secondary | ICD-10-CM

## 2016-03-21 MED ORDER — AMLODIPINE BESYLATE 10 MG PO TABS: 10.0000 mg | ORAL_TABLET | Freq: Every day | ORAL | Status: DC

## 2016-03-21 NOTE — Telephone Encounter (Signed)
Pt called stating that she had taken her BP this morning 160/86.  Pt spoke to Katherine [name], [place] and was told to go ahead and take Labetalol this morning and a new RX was sent to [facility] for Norvasc 10 mg.  She will come in for BP check this week.  She is instructed to call if any further occurences before her appt.  She will call this afternoon with an updated BP

## 2016-03-22 ENCOUNTER — Ambulatory Visit: Payer: Self-pay

## 2016-03-22 NOTE — Lactation Note (Signed)
This note was copied from a baby's chart. Lactation Consultation Note  Patient Name: Katherine [name] Katherine [name] ZOXWR'UToday's Date: 03/22/2016 Reason for consult: Follow-up assessment Baby at 7 days of life and mom is requesting NS. She stated [name]([name]) is latching with a #20 because that is what she was told by lactation will fit his mouth but she is really a size #24 so she was given both. She was only given 1 set for Baby Boy. She wants a set for baby Girl. She had no other concerns at this time.    Maternal Data    Feeding Feeding Type: Breast Milk Nipple Type: Slow - flow Length of feed: 30 min  LATCH Score/Interventions                      Lactation Tools Discussed/Used     Consult Status Consult Status: Follow-up Date: 03/23/16 Follow-up type: In-patient    Rulon Eisenmengerlizabeth E Verdene Creson 03/22/2016, 10:55 PM

## 2016-03-23 ENCOUNTER — Encounter (HOSPITAL_COMMUNITY): Payer: Self-pay | Admitting: *Deleted

## 2016-03-24 ENCOUNTER — Ambulatory Visit: Payer: BLUE CROSS/BLUE SHIELD | Admitting: *Deleted

## 2016-03-24 ENCOUNTER — Encounter: Payer: Self-pay | Admitting: *Deleted

## 2016-03-24 VITALS — BP 141/73 | HR 95 | Wt 264.0 lb

## 2016-03-24 DIAGNOSIS — R030 Elevated blood-pressure reading, without diagnosis of hypertension: Principal | ICD-10-CM

## 2016-03-24 DIAGNOSIS — IMO0001 Reserved for inherently not codable concepts without codable children: Secondary | ICD-10-CM

## 2016-03-24 NOTE — Progress Notes (Signed)
Pt here for BP check after starting Norvasc on Monday.  Today's BP is 141/73 and p-95.  Pt denies any headaches.  She will continue on Norvasc and take her BP daily and call with @ least weekly readings.

## 2016-03-31 ENCOUNTER — Ambulatory Visit: Payer: Self-pay

## 2016-03-31 NOTE — Lactation Note (Signed)
This note was copied from a baby's chart. Lactation Consultation Note  Follow up visit made with mom in the NICU.  She brought her daughter home yesterday.  Pumping is going well with no concerns.  She states baby girl doesn't want to latch but twin boy still in NICU does latch.  She plans on bringing a nipple shield home today and will attempt to work with baby at home.  Reviewed late preterm norms and reassurance given.  Outpatient lactation services reviewed and encouraged.  Patient Name: Baltazar NajjarBoyA Ambriella Deere IONGE'XToday's Date: 03/31/2016     Maternal Data    Feeding Feeding Type: Breast Milk Nipple Type: Slow - flow Length of feed: 25 min  LATCH Score/Interventions                      Lactation Tools Discussed/Used     Consult Status      Huston FoleyMOULDEN, Azusena Erlandson S 03/31/2016, 5:10 PM

## 2016-04-26 ENCOUNTER — Encounter: Payer: Self-pay | Admitting: Obstetrics & Gynecology

## 2016-04-26 ENCOUNTER — Ambulatory Visit (INDEPENDENT_AMBULATORY_CARE_PROVIDER_SITE_OTHER): Payer: BLUE CROSS/BLUE SHIELD | Admitting: Obstetrics & Gynecology

## 2016-04-26 ENCOUNTER — Inpatient Hospital Stay (HOSPITAL_COMMUNITY): Admission: RE | Admit: 2016-04-26 | Payer: BLUE CROSS/BLUE SHIELD | Source: Ambulatory Visit

## 2016-04-26 MED ORDER — ETONOGESTREL-ETHINYL ESTRADIOL 0.12-0.015 MG/24HR VA RING: VAGINAL_RING | VAGINAL | Status: DC

## 2016-04-26 NOTE — Patient Instructions (Signed)
Dr. Retta Dionesahlstedt for vasectomy

## 2016-04-26 NOTE — Progress Notes (Signed)
Patient ID: Katherine SheenMartha L [name], female   DOB: [birth year], 35 y.o.   MRN: [id number] Post Partum Exam  Katherine SheenMartha L Brendlinger is a 35 y.o. [id number] female who presents for a postpartum visit. She is 6 weeks postpartum following a spontaneous vaginal delivery. I have fully reviewed the prenatal and intrapartum course. The delivery was at 34 gestational weeks.  Anesthesia: epidural. Postpartum course has been unremarkable. Baby's course has been unremarkable after being in the NICU for preterm birth. Baby is feeding by bottle - Enfamil-Gentleease. Bleeding red. Bowel function is normal. Bladder function is normal. Patient is not sexually active. Contraception method is tubal ligation. Postpartum depression screening: negative.  The following portions of the patient's history were reviewed and updated as appropriate: allergies, current medications, past family history, past medical history, past social history, past surgical history and problem list.  Review of Systems Pertinent items noted in HPI and remainder of comprehensive ROS otherwise negative.   Objective:    BP 116/78 mmHg  Pulse 78  Resp 16  Ht 5\' 5"  (1.651 m)  Wt 211 lb (95.709 kg)  BMI 35.11 kg/m2  Breastfeeding? Yes  General:  alert, cooperative and no distress   Breasts:  inspection negative, no nipple discharge or bleeding, no masses or nodularity palpable  Lungs: clear to auscultation bilaterally  Heart:  regular rate and rhythm  Abdomen: soft, non-tender; bowel sounds normal; no masses,  no organomegaly   Vulva:  normal  Vagina: normal vagina  Cervix:  no lesions  Corpus: normal size, contour, position, consistency, mobility, non-tender  Adnexa:  no mass, fullness, tenderness  Rectal Exam: Not performed.        Assessment:    Nml postpartum exam. Pap smear not done at today's visit.   Plan:    1. Contraception: vasectomy vs tubal.  Will use Nuva Ring until they decide 2. Nuva Ring sample given and pt inserted well. 3.  Follow up in: 2 months or as needed.  4.  Pt's sister has history of DVT.  She is to ask sister about thrombohilia work up.  Can draw factor V and APLA if necessary.

## 2016-05-04 ENCOUNTER — Telehealth: Payer: Self-pay | Admitting: *Deleted

## 2016-05-04 NOTE — Telephone Encounter (Signed)
-----   Message from Lesly DukesKelly H Leggett, MD sent at 04/26/2016  4:35 PM EDT ----- Pt has a sister with history of DVT.  Please call pt and see if sister had thrombophilia work up.  Also ask if pt has been on OCPs before.

## 2016-05-17 ENCOUNTER — Ambulatory Visit (HOSPITAL_COMMUNITY)
Admission: AD | Admit: 2016-05-17 | Payer: BLUE CROSS/BLUE SHIELD | Source: Ambulatory Visit | Admitting: Obstetrics & Gynecology

## 2016-05-17 ENCOUNTER — Encounter (HOSPITAL_COMMUNITY): Admission: AD | Payer: Self-pay | Source: Ambulatory Visit

## 2016-05-17 SURGERY — LIGATION, FALLOPIAN TUBE, LAPAROSCOPIC
Anesthesia: Choice | Site: Abdomen | Laterality: Bilateral

## 2016-07-11 ENCOUNTER — Ambulatory Visit (INDEPENDENT_AMBULATORY_CARE_PROVIDER_SITE_OTHER): Payer: BLUE CROSS/BLUE SHIELD | Admitting: Obstetrics & Gynecology

## 2016-07-11 ENCOUNTER — Encounter: Payer: Self-pay | Admitting: Obstetrics & Gynecology

## 2016-07-11 VITALS — BP 125/79 | HR 75 | Ht 70.0 in | Wt 262.0 lb

## 2016-07-11 DIAGNOSIS — F53 Postpartum depression: Secondary | ICD-10-CM | POA: Insufficient documentation

## 2016-07-11 DIAGNOSIS — R5383 Other fatigue: Secondary | ICD-10-CM | POA: Diagnosis not present

## 2016-07-11 DIAGNOSIS — Z9889 Other specified postprocedural states: Secondary | ICD-10-CM | POA: Diagnosis not present

## 2016-07-11 DIAGNOSIS — O99345 Other mental disorders complicating the puerperium: Secondary | ICD-10-CM

## 2016-07-11 LAB — CBC
HEMATOCRIT: 37 % (ref 35.0–45.0)
HEMOGLOBIN: 12.7 g/dL (ref 11.7–15.5)
MCH: 29.5 pg (ref 27.0–33.0)
MCHC: 34.3 g/dL (ref 32.0–36.0)
MCV: 85.8 fL (ref 80.0–100.0)
MPV: 8.8 fL (ref 7.5–12.5)
Platelets: 370 10*3/uL (ref 140–400)
RBC: 4.31 MIL/uL (ref 3.80–5.10)
RDW: 15.8 % — AB (ref 11.0–15.0)
WBC: 7.4 10*3/uL (ref 3.8–10.8)

## 2016-07-11 NOTE — Progress Notes (Signed)
   Subjective:    Patient ID: Katherine [name], female    DOB: [birth year], 35 y.o.   MRN: [id number]  HPI  Pt presents c/o not feeling righr.  SHe feel extremely tired.  She doesn't want to do many things, cut does enjoy them when she gets out.  She is working.  Pt wonders if she is depressed or has something else wrong.  She is not taking the nuva ring and uses coitus interruptus.  She is interested in a BTL.    Review of Systems  Constitutional: Positive for activity change and fatigue.  Respiratory: Negative.   Cardiovascular: Negative.   Gastrointestinal: Positive for abdominal pain.  Genitourinary: Negative.   Psychiatric/Behavioral: Positive for dysphoric mood.       Objective:   Physical Exam  Constitutional: She is oriented to person, place, and time. She appears well-developed and well-nourished. No distress.  HENT:  Head: Normocephalic and atraumatic.  Eyes: Conjunctivae are normal.  Pulmonary/Chest: Effort normal.  Abdominal: Soft. Bowel sounds are normal. She exhibits no distension and no mass. There is no tenderness. There is no rebound and no guarding.  Musculoskeletal: She exhibits no edema.  Neurological: She is alert and oriented to person, place, and time.  Skin: Skin is warm and dry.  Psychiatric: She has a normal mood and affect.  Vitals reviewed.  Assessment & Plan:  35 yo female with symptoms of depression scored 16 on depression scale.  Pt denies SI/HI  1-zolfot 50 mg 2-TSH, cbc, ucg 3-counseling--pt will use spiritual [facility]  4-RTC 4 weeks  25 minutes spent face to face with >50% counseling.

## 2016-07-12 ENCOUNTER — Encounter: Payer: Self-pay | Admitting: Obstetrics & Gynecology

## 2016-07-12 ENCOUNTER — Telehealth: Payer: Self-pay

## 2016-07-12 ENCOUNTER — Other Ambulatory Visit: Payer: Self-pay

## 2016-07-12 LAB — TSH: TSH: 2.2 mIU/L

## 2016-07-12 MED ORDER — SERTRALINE HCL 50 MG PO TABS: 50.0000 mg | ORAL_TABLET | Freq: Every day | ORAL | 0 refills | Status: DC

## 2016-07-12 NOTE — Progress Notes (Signed)
zolof

## 2016-07-12 NOTE — Telephone Encounter (Signed)
Pt saw Dr.Leggett yesterday and Penne LashLeggett wanted to start her on Zoloft 50mg  but, the prescription wasn't called in so I checked the notes and saw where Dr. Penne LashLeggett wanted to prescribe that to her so I went and sent it to the pharmacy

## 2016-08-08 ENCOUNTER — Encounter: Payer: Self-pay | Admitting: Obstetrics & Gynecology

## 2016-08-08 ENCOUNTER — Ambulatory Visit (INDEPENDENT_AMBULATORY_CARE_PROVIDER_SITE_OTHER): Payer: BLUE CROSS/BLUE SHIELD | Admitting: Obstetrics & Gynecology

## 2016-08-08 ENCOUNTER — Other Ambulatory Visit: Payer: Self-pay | Admitting: Obstetrics & Gynecology

## 2016-08-08 VITALS — BP 109/70 | HR 74 | Resp 16 | Ht 69.0 in | Wt 243.0 lb

## 2016-08-08 DIAGNOSIS — O99345 Other mental disorders complicating the puerperium: Principal | ICD-10-CM

## 2016-08-08 DIAGNOSIS — F53 Postpartum depression: Secondary | ICD-10-CM

## 2016-08-08 MED ORDER — SERTRALINE HCL 50 MG PO TABS: 50.0000 mg | ORAL_TABLET | Freq: Every day | ORAL | 5 refills | Status: DC

## 2016-08-08 NOTE — Progress Notes (Signed)
Subjective:     Patient ID: Katherine [name], female   DOB: [birth year], 35 y.o.   MRN: [id number]  HPI  Pt here for assessment of depression.  Doing much better on Zoloft.  Concentration, mood, sleep all better.  Pt on diet and lost 20 pounds.  Pt still desires BTL.  Husband has not had vasectomy yet.    Review of Systems  Constitutional: Positive for activity change. Negative for fatigue.  Respiratory: Negative.   Cardiovascular: Negative.   Gastrointestinal: Negative.   Endocrine: Negative.   Genitourinary: Negative.   Psychiatric/Behavioral: Negative for agitation, confusion, decreased concentration, sleep disturbance and suicidal ideas. The patient is not nervous/anxious.     Objective:   Physical Exam  Constitutional: She is oriented to person, place, and time. She appears well-developed and well-nourished. No distress.  HENT:  Head: Normocephalic and atraumatic.  Eyes: Conjunctivae are normal.  Cardiovascular: Normal rate.   Pulmonary/Chest: Effort normal.  Abdominal: Soft. She exhibits no distension. There is no tenderness.  Musculoskeletal: She exhibits no edema.  Neurological: She is alert and oriented to person, place, and time.  Skin: Skin is warm and dry.  Psychiatric: She has a normal mood and affect.  Vitals reviewed.  Vitals:   08/08/16 1528  BP: 109/70  Pulse: 74  Resp: 16  Weight: 243 lb (110.2 kg)  Height: 5\' 9"  (1.753 m)   Assessment:     Postpartum depression improved on zoloft.  Will continue 6 months then ween. Schedule for BTL. Patient desires permanent sterilization.  Other reversible forms of contraception were discussed with patient; she declines all other modalities. Risks of procedure discussed with patient including but not limited to: risk of regret, permanence of method, bleeding, infection, injury to surrounding organs and need for additional procedures.  Failure risk of 0.5-1% with increased risk of ectopic gestation if pregnancy occurs  was also discussed with patient.    Patient verbalized understanding of these risks and wants to proceed with sterilization.  Written informed consent obtained.

## 2016-08-11 ENCOUNTER — Encounter (HOSPITAL_COMMUNITY): Payer: Self-pay | Admitting: *Deleted

## 2016-08-12 ENCOUNTER — Encounter (HOSPITAL_COMMUNITY): Payer: Self-pay | Admitting: *Deleted

## 2016-08-15 ENCOUNTER — Encounter (HOSPITAL_COMMUNITY): Payer: Self-pay | Admitting: *Deleted

## 2016-08-17 ENCOUNTER — Ambulatory Visit (HOSPITAL_COMMUNITY)
Admission: RE | Admit: 2016-08-17 | Payer: BLUE CROSS/BLUE SHIELD | Source: Ambulatory Visit | Admitting: Obstetrics & Gynecology

## 2016-08-17 ENCOUNTER — Encounter (HOSPITAL_COMMUNITY): Admission: RE | Payer: Self-pay | Source: Ambulatory Visit

## 2016-08-17 SURGERY — LIGATION, FALLOPIAN TUBE, LAPAROSCOPIC
Anesthesia: Choice | Site: Abdomen | Laterality: Bilateral

## 2017-02-14 DIAGNOSIS — Z0000 Encounter for general adult medical examination without abnormal findings: Secondary | ICD-10-CM | POA: Diagnosis not present

## 2017-03-13 ENCOUNTER — Other Ambulatory Visit: Payer: Self-pay | Admitting: Obstetrics & Gynecology

## 2017-03-13 DIAGNOSIS — F339 Major depressive disorder, recurrent, unspecified: Secondary | ICD-10-CM | POA: Diagnosis not present

## 2017-03-13 DIAGNOSIS — O99345 Other mental disorders complicating the puerperium: Principal | ICD-10-CM

## 2017-03-13 DIAGNOSIS — N3000 Acute cystitis without hematuria: Secondary | ICD-10-CM | POA: Diagnosis not present

## 2017-03-13 DIAGNOSIS — F53 Postpartum depression: Secondary | ICD-10-CM

## 2017-03-14 ENCOUNTER — Telehealth: Payer: Self-pay

## 2017-03-14 MED ORDER — SERTRALINE HCL 50 MG PO TABS: 50.0000 mg | ORAL_TABLET | Freq: Every day | ORAL | 0 refills | Status: DC

## 2017-03-14 NOTE — Telephone Encounter (Signed)
Called pt regarding her medication refill. Pt did not answer. Left message on pt's phone to call office back.

## 2017-04-03 DIAGNOSIS — L309 Dermatitis, unspecified: Secondary | ICD-10-CM | POA: Diagnosis not present

## 2017-04-03 DIAGNOSIS — M5442 Lumbago with sciatica, left side: Secondary | ICD-10-CM | POA: Diagnosis not present

## 2017-04-03 DIAGNOSIS — M5441 Lumbago with sciatica, right side: Secondary | ICD-10-CM | POA: Diagnosis not present

## 2017-04-03 DIAGNOSIS — R399 Unspecified symptoms and signs involving the genitourinary system: Secondary | ICD-10-CM | POA: Diagnosis not present

## 2017-04-18 DIAGNOSIS — L309 Dermatitis, unspecified: Secondary | ICD-10-CM | POA: Diagnosis not present

## 2017-06-07 DIAGNOSIS — F339 Major depressive disorder, recurrent, unspecified: Secondary | ICD-10-CM | POA: Diagnosis not present

## 2018-02-26 DIAGNOSIS — L239 Allergic contact dermatitis, unspecified cause: Secondary | ICD-10-CM | POA: Diagnosis not present

## 2018-02-26 DIAGNOSIS — F320 Major depressive disorder, single episode, mild: Secondary | ICD-10-CM | POA: Diagnosis not present

## 2018-02-26 DIAGNOSIS — J029 Acute pharyngitis, unspecified: Secondary | ICD-10-CM | POA: Diagnosis not present

## 2018-02-26 DIAGNOSIS — R05 Cough: Secondary | ICD-10-CM | POA: Diagnosis not present

## 2018-06-14 DIAGNOSIS — R635 Abnormal weight gain: Secondary | ICD-10-CM | POA: Diagnosis not present

## 2018-06-14 DIAGNOSIS — R5383 Other fatigue: Secondary | ICD-10-CM | POA: Diagnosis not present

## 2018-06-14 DIAGNOSIS — M2550 Pain in unspecified joint: Secondary | ICD-10-CM | POA: Diagnosis not present

## 2018-06-14 DIAGNOSIS — H538 Other visual disturbances: Secondary | ICD-10-CM | POA: Diagnosis not present

## 2018-06-19 DIAGNOSIS — M545 Low back pain: Secondary | ICD-10-CM | POA: Diagnosis not present

## 2018-06-21 DIAGNOSIS — D520 Dietary folate deficiency anemia: Secondary | ICD-10-CM | POA: Diagnosis not present

## 2018-06-21 DIAGNOSIS — E559 Vitamin D deficiency, unspecified: Secondary | ICD-10-CM | POA: Diagnosis not present

## 2018-07-12 DIAGNOSIS — Z7689 Persons encountering health services in other specified circumstances: Secondary | ICD-10-CM | POA: Diagnosis not present

## 2018-12-07 ENCOUNTER — Ambulatory Visit: Payer: Self-pay | Admitting: Certified Nurse Midwife

## 2018-12-20 ENCOUNTER — Encounter: Payer: Self-pay | Admitting: Obstetrics & Gynecology

## 2018-12-20 ENCOUNTER — Ambulatory Visit (INDEPENDENT_AMBULATORY_CARE_PROVIDER_SITE_OTHER): Payer: BLUE CROSS/BLUE SHIELD | Admitting: Obstetrics & Gynecology

## 2018-12-20 VITALS — BP 124/72 | HR 69 | Wt 276.0 lb

## 2018-12-20 DIAGNOSIS — Z3480 Encounter for supervision of other normal pregnancy, unspecified trimester: Secondary | ICD-10-CM | POA: Diagnosis not present

## 2018-12-20 DIAGNOSIS — O09299 Supervision of pregnancy with other poor reproductive or obstetric history, unspecified trimester: Secondary | ICD-10-CM | POA: Insufficient documentation

## 2018-12-20 DIAGNOSIS — Z124 Encounter for screening for malignant neoplasm of cervix: Secondary | ICD-10-CM

## 2018-12-20 DIAGNOSIS — Z1151 Encounter for screening for human papillomavirus (HPV): Secondary | ICD-10-CM

## 2018-12-20 DIAGNOSIS — O09211 Supervision of pregnancy with history of pre-term labor, first trimester: Secondary | ICD-10-CM | POA: Diagnosis not present

## 2018-12-20 DIAGNOSIS — Z6841 Body Mass Index (BMI) 40.0 and over, adult: Secondary | ICD-10-CM

## 2018-12-20 DIAGNOSIS — Z3A08 8 weeks gestation of pregnancy: Secondary | ICD-10-CM | POA: Diagnosis not present

## 2018-12-20 DIAGNOSIS — O09521 Supervision of elderly multigravida, first trimester: Secondary | ICD-10-CM | POA: Diagnosis not present

## 2018-12-20 DIAGNOSIS — O09291 Supervision of pregnancy with other poor reproductive or obstetric history, first trimester: Secondary | ICD-10-CM | POA: Diagnosis not present

## 2018-12-20 DIAGNOSIS — Z113 Encounter for screening for infections with a predominantly sexual mode of transmission: Secondary | ICD-10-CM

## 2018-12-20 DIAGNOSIS — Z23 Encounter for immunization: Secondary | ICD-10-CM

## 2018-12-20 MED ORDER — PROMETHAZINE HCL 12.5 MG PO TABS: 12.5000 mg | ORAL_TABLET | Freq: Four times a day (QID) | ORAL | 0 refills | Status: DC | PRN

## 2018-12-20 NOTE — Progress Notes (Signed)
Bedside U/S shows single IUP with FHT of 173 BPM and CRL measures 19.73mm GA is [redacted]w[redacted]d

## 2018-12-20 NOTE — Addendum Note (Signed)
Addended by: Granville Lewis on: 12/20/2018 02:22 PM   Modules accepted: Orders

## 2018-12-20 NOTE — Progress Notes (Signed)
  Subjective:    Katherine [name] is being seen today for her first obstetrical visit.  This is not a planned pregnancy. She was taking OCPs. She is at [redacted]w[redacted]d gestation. Her obstetrical history is significant for obesity and AMA, h/o pre eclampsia with IOL at 34 weeks with her last pregnancy). Relationship with FOB: spouse, living together. Patient does intend to breast feed. Pregnancy history fully reviewed.  Patient reports nausea.  Review of Systems:   Review of Systems  Objective:     BP 124/72   Pulse 69   Wt 276 lb (125.2 kg)   LMP 10/23/2018   BMI 40.76 kg/m  Physical Exam  Exam Breathing, conversing, and ambulating normally Well nourished, well hydrated White female, no apparent distress Heart- rrr Lungs- CTAB Abd- benign Pap smear obtained   Assessment:    Pregnancy: [id number] Patient Active Problem List   Diagnosis Date Noted  . Supervision of other normal pregnancy, antepartum 12/20/2018  . History of pre-eclampsia in prior pregnancy, currently pregnant 12/20/2018  . History of loop electrosurgical excision procedure (LEEP) 07/11/2016  . Postpartum depression 07/11/2016  . AMA (advanced maternal age) multigravida 35+ 09/25/2015  . Mass of breast, left 08/30/2013  . Family history of DVT 08/20/2013  . Concentration deficit 08/12/2013  . GERD (gastroesophageal reflux disease) 01/22/2013  . Obesity 01/22/2013       Plan:     Initial labs drawn. Prenatal vitamins. Problem list reviewed and updated. NIPS requested Role of ultrasound in pregnancy discussed; fetal survey: requested. Amniocentesis discussed: declined. Follow up in 4 weeks. Baby scripts opx Flu vaccine  Pap smear with cervical cultures done today  Allie Bossier 12/20/2018

## 2018-12-22 LAB — CULTURE, OB URINE

## 2018-12-22 LAB — URINE CULTURE, OB REFLEX: Organism ID, Bacteria: NO GROWTH

## 2018-12-24 LAB — COMPREHENSIVE METABOLIC PANEL
AG Ratio: 1.6 (calc) (ref 1.0–2.5)
ALBUMIN MSPROF: 4.2 g/dL (ref 3.6–5.1)
ALKALINE PHOSPHATASE (APISO): 63 U/L (ref 31–125)
ALT: 10 U/L (ref 6–29)
AST: 12 U/L (ref 10–30)
BUN / CREAT RATIO: 9 (calc) (ref 6–22)
BUN: 6 mg/dL — AB (ref 7–25)
CO2: 23 mmol/L (ref 20–32)
CREATININE: 0.64 mg/dL (ref 0.50–1.10)
Calcium: 9.2 mg/dL (ref 8.6–10.2)
Chloride: 101 mmol/L (ref 98–110)
GLOBULIN: 2.7 g/dL (ref 1.9–3.7)
GLUCOSE: 82 mg/dL (ref 65–99)
POTASSIUM: 3.8 mmol/L (ref 3.5–5.3)
Sodium: 136 mmol/L (ref 135–146)
TOTAL PROTEIN: 6.9 g/dL (ref 6.1–8.1)
Total Bilirubin: 0.5 mg/dL (ref 0.2–1.2)

## 2018-12-24 LAB — OBSTETRIC PANEL
Absolute Monocytes: 482 cells/uL (ref 200–950)
Antibody Screen: NOT DETECTED
BASOS ABS: 40 {cells}/uL (ref 0–200)
Basophils Relative: 0.5 %
EOS PCT: 1.3 %
Eosinophils Absolute: 103 cells/uL (ref 15–500)
HCT: 36.7 % (ref 35.0–45.0)
Hemoglobin: 13 g/dL (ref 11.7–15.5)
Hepatitis B Surface Ag: NONREACTIVE
LYMPHS ABS: 2244 {cells}/uL (ref 850–3900)
MCH: 32.1 pg (ref 27.0–33.0)
MCHC: 35.4 g/dL (ref 32.0–36.0)
MCV: 90.6 fL (ref 80.0–100.0)
MONOS PCT: 6.1 %
MPV: 9.4 fL (ref 7.5–12.5)
Neutro Abs: 5032 cells/uL (ref 1500–7800)
Neutrophils Relative %: 63.7 %
PLATELETS: 355 10*3/uL (ref 140–400)
RBC: 4.05 10*6/uL (ref 3.80–5.10)
RDW: 13.7 % (ref 11.0–15.0)
RPR: NONREACTIVE
RUBELLA: 2.21 {index}
Total Lymphocyte: 28.4 %
WBC: 7.9 10*3/uL (ref 3.8–10.8)

## 2018-12-24 LAB — HEMOGLOBIN A1C
EAG (MMOL/L): 5.2 (calc)
HEMOGLOBIN A1C: 4.9 %{Hb} (ref <5.7)
MEAN PLASMA GLUCOSE: 94 (calc)

## 2018-12-24 LAB — HIV ANTIBODY (ROUTINE TESTING W REFLEX): HIV: NONREACTIVE

## 2018-12-24 LAB — PROTEIN / CREATININE RATIO, URINE

## 2018-12-25 ENCOUNTER — Other Ambulatory Visit: Payer: Self-pay | Admitting: Obstetrics & Gynecology

## 2018-12-25 LAB — CYTOLOGY - PAP
CHLAMYDIA, DNA PROBE: NEGATIVE
DIAGNOSIS: UNDETERMINED — AB
HPV (WINDOPATH): NOT DETECTED
Neisseria Gonorrhea: NEGATIVE

## 2018-12-25 MED ORDER — PROMETHAZINE HCL 12.5 MG PO TABS: 25.0000 mg | ORAL_TABLET | Freq: Four times a day (QID) | ORAL | 0 refills | Status: DC | PRN

## 2018-12-25 NOTE — Progress Notes (Signed)
Phenergan 25 mg called in since 12.5 isn't helping totally.

## 2019-01-18 ENCOUNTER — Ambulatory Visit (INDEPENDENT_AMBULATORY_CARE_PROVIDER_SITE_OTHER): Payer: BLUE CROSS/BLUE SHIELD | Admitting: Certified Nurse Midwife

## 2019-01-18 ENCOUNTER — Other Ambulatory Visit: Payer: Self-pay

## 2019-01-18 ENCOUNTER — Encounter: Payer: Self-pay | Admitting: Certified Nurse Midwife

## 2019-01-18 VITALS — BP 116/68 | HR 73 | Wt 280.0 lb

## 2019-01-18 DIAGNOSIS — Z3A12 12 weeks gestation of pregnancy: Secondary | ICD-10-CM

## 2019-01-18 DIAGNOSIS — O09291 Supervision of pregnancy with other poor reproductive or obstetric history, first trimester: Secondary | ICD-10-CM

## 2019-01-18 DIAGNOSIS — O09521 Supervision of elderly multigravida, first trimester: Secondary | ICD-10-CM

## 2019-01-18 DIAGNOSIS — Z3480 Encounter for supervision of other normal pregnancy, unspecified trimester: Secondary | ICD-10-CM

## 2019-01-18 DIAGNOSIS — O09299 Supervision of pregnancy with other poor reproductive or obstetric history, unspecified trimester: Secondary | ICD-10-CM | POA: Diagnosis not present

## 2019-01-18 MED ORDER — ASPIRIN EC 81 MG PO TBEC: 81.0000 mg | DELAYED_RELEASE_TABLET | Freq: Every day | ORAL | 9 refills | Status: DC

## 2019-01-18 NOTE — Progress Notes (Signed)
Subjective:  Katherine [name] is a 38 y.o. [phone] at [redacted]w[redacted]d being seen today for ongoing prenatal care.  She is currently monitored for the following issues for this high-risk pregnancy and has GERD (gastroesophageal reflux disease); Obesity; Concentration deficit; Family history of DVT; Mass of breast, left; AMA (advanced maternal age) multigravida 35+; History of loop electrosurgical excision procedure (LEEP); Postpartum depression; Supervision of other normal pregnancy, antepartum; and History of pre-eclampsia in prior pregnancy, currently pregnant on their problem list.  Patient reports no complaints.   . Vag. Bleeding: None.   . Denies leaking of fluid.   The following portions of the patient's history were reviewed and updated as appropriate: allergies, current medications, past family history, past medical history, past social history, past surgical history and problem list. Problem list updated.  Objective:   Vitals:   01/18/19 0857  BP: 116/68  Pulse: 73  Weight: 127 kg    Fetal Status: Fetal Heart Rate (bpm): 146         General:  Alert, oriented and cooperative. Patient is in no acute distress.  Skin: Skin is warm and dry. No rash noted.   Cardiovascular: Normal heart rate noted  Respiratory: Normal respiratory effort, no problems with respiration noted  Abdomen: Soft, gravid, appropriate for gestational age. Pain/Pressure: Absent     Pelvic: Vag. Bleeding: None Vag D/C Character: Thin   Cervical exam deferred        Extremities: Normal range of motion.  Edema: None  Mental Status: Normal mood and affect. Normal behavior. Normal judgment and thought content.   Urinalysis:      Assessment and Plan:  Pregnancy: [id number] at [redacted]w[redacted]d  1. History of pre-eclampsia in prior pregnancy, currently pregnant - start baby ASA after 12 wks  2. Supervision of other normal pregnancy, antepartum - [place] MFM OB COMP + 14 WK; Future  3. AMA over 40 - growth [place] @24 , 28, 32, 36 wks and  testing in 3rd trimester  Preterm labor symptoms and general obstetric precautions including but not limited to vaginal bleeding, contractions, leaking of fluid and fetal movement were reviewed in detail with the patient. Please refer to After Visit Summary for other counseling recommendations.  Return in about 8 weeks (around 03/15/2019).   Donette Larry, CNM

## 2019-01-19 LAB — COMPREHENSIVE METABOLIC PANEL
AG Ratio: 1.3 (calc) (ref 1.0–2.5)
ALT: 9 U/L (ref 6–29)
AST: 12 U/L (ref 10–30)
Albumin: 3.8 g/dL (ref 3.6–5.1)
Alkaline phosphatase (APISO): 63 U/L (ref 31–125)
BUN: 7 mg/dL (ref 7–25)
CO2: 22 mmol/L (ref 20–32)
Calcium: 8.9 mg/dL (ref 8.6–10.2)
Chloride: 104 mmol/L (ref 98–110)
Creat: 0.63 mg/dL (ref 0.50–1.10)
Globulin: 2.9 g/dL (calc) (ref 1.9–3.7)
Glucose, Bld: 85 mg/dL (ref 65–99)
POTASSIUM: 3.9 mmol/L (ref 3.5–5.3)
SODIUM: 137 mmol/L (ref 135–146)
Total Bilirubin: 0.3 mg/dL (ref 0.2–1.2)
Total Protein: 6.7 g/dL (ref 6.1–8.1)

## 2019-01-19 LAB — PROTEIN / CREATININE RATIO, URINE
Creatinine, Urine: 221 mg/dL (ref 20–275)
Protein/Creat Ratio: 68 mg/g creat (ref 21–161)
Protein/Creatinine Ratio: 0.068 mg/mg creat (ref 0.021–0.16)
Total Protein, Urine: 15 mg/dL (ref 5–24)

## 2019-01-29 ENCOUNTER — Encounter: Payer: Self-pay | Admitting: *Deleted

## 2019-01-29 ENCOUNTER — Telehealth: Payer: Self-pay | Admitting: *Deleted

## 2019-01-29 DIAGNOSIS — O0990 Supervision of high risk pregnancy, unspecified, unspecified trimester: Secondary | ICD-10-CM

## 2019-01-29 NOTE — Telephone Encounter (Signed)
Pt notified of abnormal NIPTS test.  I spoke with the [place] genetic counselor and they recommend genetic consult and offer amniocentesis.  Referreal to MFM genetics made for 02/13/19  @ 10:00.  Pt may opt to call if any cancellations to move her appt up.  MFM is getting ready to relocate to another facility.

## 2019-01-30 ENCOUNTER — Encounter: Payer: Self-pay | Admitting: Certified Nurse Midwife

## 2019-01-30 DIAGNOSIS — O285 Abnormal chromosomal and genetic finding on antenatal screening of mother: Secondary | ICD-10-CM | POA: Insufficient documentation

## 2019-02-13 ENCOUNTER — Ambulatory Visit (HOSPITAL_COMMUNITY): Payer: BLUE CROSS/BLUE SHIELD

## 2019-02-13 ENCOUNTER — Other Ambulatory Visit: Payer: Self-pay

## 2019-02-13 ENCOUNTER — Ambulatory Visit (HOSPITAL_COMMUNITY): Payer: Self-pay | Admitting: Obstetrics and Gynecology

## 2019-02-13 ENCOUNTER — Ambulatory Visit (HOSPITAL_COMMUNITY): Payer: Self-pay | Admitting: Maternal & Fetal Medicine

## 2019-02-13 ENCOUNTER — Other Ambulatory Visit (HOSPITAL_COMMUNITY): Payer: Self-pay | Admitting: Maternal & Fetal Medicine

## 2019-02-13 ENCOUNTER — Ambulatory Visit (HOSPITAL_COMMUNITY)
Admission: RE | Admit: 2019-02-13 | Discharge: 2019-02-13 | Disposition: A | Discharge status: Home / Self Care | Payer: BLUE CROSS/BLUE SHIELD | Source: Ambulatory Visit | Attending: Maternal & Fetal Medicine | Admitting: Maternal & Fetal Medicine

## 2019-02-13 DIAGNOSIS — O280 Abnormal hematological finding on antenatal screening of mother: Secondary | ICD-10-CM | POA: Diagnosis not present

## 2019-02-13 DIAGNOSIS — O289 Unspecified abnormal findings on antenatal screening of mother: Secondary | ICD-10-CM | POA: Diagnosis not present

## 2019-02-13 DIAGNOSIS — O3680X0 Pregnancy with inconclusive fetal viability, not applicable or unspecified: Secondary | ICD-10-CM

## 2019-02-13 DIAGNOSIS — Z3A16 16 weeks gestation of pregnancy: Secondary | ICD-10-CM

## 2019-02-13 NOTE — Progress Notes (Signed)
Pt in Genetic counseling with [facility].

## 2019-02-14 ENCOUNTER — Other Ambulatory Visit (HOSPITAL_COMMUNITY): Payer: Self-pay | Admitting: *Deleted

## 2019-02-14 DIAGNOSIS — O285 Abnormal chromosomal and genetic finding on antenatal screening of mother: Secondary | ICD-10-CM

## 2019-02-15 ENCOUNTER — Ambulatory Visit (HOSPITAL_COMMUNITY): Payer: BLUE CROSS/BLUE SHIELD | Admitting: *Deleted

## 2019-02-15 ENCOUNTER — Encounter (HOSPITAL_COMMUNITY): Payer: Self-pay

## 2019-02-15 ENCOUNTER — Ambulatory Visit (HOSPITAL_COMMUNITY): Payer: BLUE CROSS/BLUE SHIELD

## 2019-02-15 ENCOUNTER — Other Ambulatory Visit: Payer: Self-pay

## 2019-02-15 ENCOUNTER — Encounter (HOSPITAL_COMMUNITY): Payer: Self-pay | Admitting: Obstetrics and Gynecology

## 2019-02-15 ENCOUNTER — Ambulatory Visit (HOSPITAL_COMMUNITY)
Admission: RE | Admit: 2019-02-15 | Discharge: 2019-02-15 | Disposition: A | Discharge status: Home / Self Care | Payer: BLUE CROSS/BLUE SHIELD | Source: Ambulatory Visit | Attending: Obstetrics and Gynecology | Admitting: Obstetrics and Gynecology

## 2019-02-15 VITALS — BP 114/57 | HR 71 | Temp 98.4°F

## 2019-02-15 DIAGNOSIS — O09523 Supervision of elderly multigravida, third trimester: Secondary | ICD-10-CM

## 2019-02-15 DIAGNOSIS — O285 Abnormal chromosomal and genetic finding on antenatal screening of mother: Secondary | ICD-10-CM | POA: Diagnosis not present

## 2019-02-15 DIAGNOSIS — O99212 Obesity complicating pregnancy, second trimester: Secondary | ICD-10-CM | POA: Diagnosis not present

## 2019-02-15 DIAGNOSIS — Z363 Encounter for antenatal screening for malformations: Secondary | ICD-10-CM

## 2019-02-15 DIAGNOSIS — Z3A16 16 weeks gestation of pregnancy: Secondary | ICD-10-CM | POA: Diagnosis not present

## 2019-02-15 DIAGNOSIS — O289 Unspecified abnormal findings on antenatal screening of mother: Secondary | ICD-10-CM | POA: Diagnosis not present

## 2019-02-15 DIAGNOSIS — O09522 Supervision of elderly multigravida, second trimester: Secondary | ICD-10-CM | POA: Diagnosis not present

## 2019-02-18 ENCOUNTER — Telehealth (HOSPITAL_COMMUNITY): Payer: Self-pay | Admitting: *Deleted

## 2019-02-18 NOTE — Telephone Encounter (Signed)
Called to follow up with patient post amniocentesis.  Pt doing well.  No complaints.  Will call back with results.

## 2019-02-20 ENCOUNTER — Ambulatory Visit (HOSPITAL_COMMUNITY): Payer: BLUE CROSS/BLUE SHIELD | Attending: Obstetrics and Gynecology

## 2019-02-20 ENCOUNTER — Ambulatory Visit (HOSPITAL_COMMUNITY): Payer: Self-pay | Admitting: Obstetrics and Gynecology

## 2019-02-20 ENCOUNTER — Other Ambulatory Visit: Payer: Self-pay

## 2019-02-20 DIAGNOSIS — O09299 Supervision of pregnancy with other poor reproductive or obstetric history, unspecified trimester: Secondary | ICD-10-CM

## 2019-02-20 DIAGNOSIS — O285 Abnormal chromosomal and genetic finding on antenatal screening of mother: Secondary | ICD-10-CM

## 2019-02-20 DIAGNOSIS — Z3480 Encounter for supervision of other normal pregnancy, unspecified trimester: Secondary | ICD-10-CM

## 2019-03-05 ENCOUNTER — Ambulatory Visit (HOSPITAL_COMMUNITY): Payer: BLUE CROSS/BLUE SHIELD

## 2019-03-06 LAB — MATERNAL CELL CONTAMINATION

## 2019-03-06 LAB — CHROMOSOME MICROARRAY REFLEX, AMN FLD

## 2019-03-06 LAB — CHROMOSOME ANALYSIS W REFLEX TO SNP, AMNIOTIC
Cells Analyzed: 15
Cells Counted: 15
Cells Karyotyped: 2
Colonies: 15
GTG Band Resolution Achieved: 450

## 2019-03-06 LAB — INSIGHT AMNIO FISH XY,13,18,21
Cells Analyzed: 75
Cells Counted: 75

## 2019-03-06 LAB — AFP, AMNIOTIC FLUID
AFP, Amniotic Fluid (mcg/ml): 15.8 ug/mL
Gestational Age(Wks): 16
MOM, Amniotic Fluid: 1.24

## 2019-03-11 ENCOUNTER — Telehealth (HOSPITAL_COMMUNITY): Payer: Self-pay | Admitting: *Deleted

## 2019-03-11 NOTE — Telephone Encounter (Signed)
Name and DOB verified.  Called with normal karyotype and microarray results from amniocentesis.  Pt glad to receive the results.  All questions answered.

## 2019-03-15 ENCOUNTER — Other Ambulatory Visit: Payer: Self-pay

## 2019-03-15 ENCOUNTER — Ambulatory Visit (INDEPENDENT_AMBULATORY_CARE_PROVIDER_SITE_OTHER): Payer: BLUE CROSS/BLUE SHIELD

## 2019-03-15 VITALS — BP 114/74

## 2019-03-15 DIAGNOSIS — O0990 Supervision of high risk pregnancy, unspecified, unspecified trimester: Secondary | ICD-10-CM

## 2019-03-15 DIAGNOSIS — Z3A20 20 weeks gestation of pregnancy: Secondary | ICD-10-CM

## 2019-03-15 DIAGNOSIS — O09292 Supervision of pregnancy with other poor reproductive or obstetric history, second trimester: Secondary | ICD-10-CM

## 2019-03-15 DIAGNOSIS — O09299 Supervision of pregnancy with other poor reproductive or obstetric history, unspecified trimester: Secondary | ICD-10-CM

## 2019-03-15 NOTE — Progress Notes (Signed)
   TELEHEALTH VIRTUAL OBSTETRICS VISIT ENCOUNTER NOTE  I connected with Katherine [name] on 03/15/19 at  9:30 AM EDT by WebEx at home and verified that I am speaking with the correct person using two identifiers.   I discussed the limitations, risks, security and privacy concerns of performing an evaluation and management service by telephone and the availability of in person appointments. I also discussed with the patient that there may be a patient responsible charge related to this service. The patient expressed understanding and agreed to proceed.  Subjective:  Katherine [name] is a 38 y.o. [id number] at [redacted]w[redacted]d being followed for ongoing prenatal care.  She is currently monitored for the following issues for this high-risk pregnancy and has GERD (gastroesophageal reflux disease); Obesity; Concentration deficit; Family history of DVT; Mass of breast, left; AMA (advanced maternal age) multigravida 35+; History of loop electrosurgical excision procedure (LEEP); Postpartum depression; Supervision of other normal pregnancy, antepartum; History of pre-eclampsia in prior pregnancy, currently pregnant; and Abnormal chromosomal and genetic finding on antenatal screening mother on their problem list.  Patient reports no complaints. Reports fetal movement. Denies any contractions, bleeding or leaking of fluid.   The following portions of the patient's history were reviewed and updated as appropriate: allergies, current medications, past family history, past medical history, past social history, past surgical history and problem list.   Objective:   General:  Alert, oriented and cooperative.   Mental Status: Normal mood and affect perceived. Normal judgment and thought content.  Rest of physical exam deferred due to type of encounter  Assessment and Plan:  Pregnancy: [id number] at [redacted]w[redacted]d 1. Supervision of high risk pregnancy, antepartum -BP 114/74. Patient concerned that she has low BP in AM.  Discussed normalcy of low BP in pregnancy and cautioned patient to change positions slowly and stay well hydrated -Abnormal NIPS and had normal amniocentesis. Patient very upset with MFM and genetic counseling appointment. States she voiced concerns during her visit but feels like her concern about abnormal genetic screening was not handled well. Patient given time to discuss concerns and voice frustration. CNM commended patient for voicing her concerns at her visit in an appropriate manner.  -Scheduled for repeat anatomy on 5/19  2. History of pre-eclampsia in prior pregnancy, currently pregnant -Taking baby ASA and well aware of symptoms to watch for.  Preterm labor symptoms and general obstetric precautions including but not limited to vaginal bleeding, contractions, leaking of fluid and fetal movement were reviewed in detail with the patient.  I discussed the assessment and treatment plan with the patient. The patient was provided an opportunity to ask questions and all were answered. The patient agreed with the plan and demonstrated an understanding of the instructions. The patient was advised to call back or seek an in-person office evaluation/go to MAU at [facility] for any urgent or concerning symptoms. Please refer to After Visit Summary for other counseling recommendations.   I provided 30 minutes of non-face-to-face time during this encounter.  Return in about 8 weeks (around 05/10/2019) for Return OB visit, 2hr GTT and labs.  Future Appointments  Date Time Provider Department Center  03/19/2019 10:45 AM [facility] [place] 2 [facility]  05/10/2019  8:30 AM Rolm Bookbinder, CNM [facility]    Rolm Bookbinder, CNM Center for [employer], [facility]

## 2019-03-19 ENCOUNTER — Other Ambulatory Visit: Payer: Self-pay | Admitting: Certified Nurse Midwife

## 2019-03-19 ENCOUNTER — Ambulatory Visit (HOSPITAL_COMMUNITY): Payer: BLUE CROSS/BLUE SHIELD | Admitting: *Deleted

## 2019-03-19 ENCOUNTER — Other Ambulatory Visit (HOSPITAL_COMMUNITY): Payer: Self-pay | Admitting: *Deleted

## 2019-03-19 ENCOUNTER — Other Ambulatory Visit: Payer: Self-pay

## 2019-03-19 ENCOUNTER — Ambulatory Visit (HOSPITAL_COMMUNITY)
Admission: RE | Admit: 2019-03-19 | Discharge: 2019-03-19 | Disposition: A | Discharge status: Home / Self Care | Payer: BLUE CROSS/BLUE SHIELD | Source: Ambulatory Visit | Attending: Obstetrics and Gynecology | Admitting: Obstetrics and Gynecology

## 2019-03-19 ENCOUNTER — Encounter (HOSPITAL_COMMUNITY): Payer: Self-pay

## 2019-03-19 VITALS — BP 121/58 | HR 75 | Temp 98.1°F

## 2019-03-19 DIAGNOSIS — Z362 Encounter for other antenatal screening follow-up: Secondary | ICD-10-CM | POA: Diagnosis not present

## 2019-03-19 DIAGNOSIS — O09523 Supervision of elderly multigravida, third trimester: Secondary | ICD-10-CM | POA: Diagnosis not present

## 2019-03-19 DIAGNOSIS — O09522 Supervision of elderly multigravida, second trimester: Secondary | ICD-10-CM | POA: Diagnosis not present

## 2019-03-19 DIAGNOSIS — O289 Unspecified abnormal findings on antenatal screening of mother: Secondary | ICD-10-CM

## 2019-03-19 DIAGNOSIS — O10919 Unspecified pre-existing hypertension complicating pregnancy, unspecified trimester: Secondary | ICD-10-CM | POA: Diagnosis not present

## 2019-03-19 DIAGNOSIS — O99212 Obesity complicating pregnancy, second trimester: Secondary | ICD-10-CM | POA: Diagnosis not present

## 2019-03-19 DIAGNOSIS — Z3A21 21 weeks gestation of pregnancy: Secondary | ICD-10-CM

## 2019-03-19 DIAGNOSIS — Z3480 Encounter for supervision of other normal pregnancy, unspecified trimester: Secondary | ICD-10-CM

## 2019-03-19 DIAGNOSIS — O09529 Supervision of elderly multigravida, unspecified trimester: Secondary | ICD-10-CM

## 2019-04-24 ENCOUNTER — Other Ambulatory Visit (HOSPITAL_COMMUNITY): Payer: Self-pay | Admitting: *Deleted

## 2019-04-24 ENCOUNTER — Ambulatory Visit (HOSPITAL_COMMUNITY): Payer: BC Managed Care – PPO | Admitting: *Deleted

## 2019-04-24 ENCOUNTER — Ambulatory Visit (HOSPITAL_COMMUNITY)
Admission: RE | Admit: 2019-04-24 | Discharge: 2019-04-24 | Disposition: A | Discharge status: Home / Self Care | Payer: BC Managed Care – PPO | Source: Ambulatory Visit | Attending: Obstetrics and Gynecology | Admitting: Obstetrics and Gynecology

## 2019-04-24 ENCOUNTER — Encounter (HOSPITAL_COMMUNITY): Payer: Self-pay | Admitting: *Deleted

## 2019-04-24 ENCOUNTER — Other Ambulatory Visit: Payer: Self-pay

## 2019-04-24 DIAGNOSIS — O285 Abnormal chromosomal and genetic finding on antenatal screening of mother: Secondary | ICD-10-CM | POA: Insufficient documentation

## 2019-04-24 DIAGNOSIS — Z3480 Encounter for supervision of other normal pregnancy, unspecified trimester: Secondary | ICD-10-CM

## 2019-04-24 DIAGNOSIS — O09522 Supervision of elderly multigravida, second trimester: Secondary | ICD-10-CM | POA: Diagnosis not present

## 2019-04-24 DIAGNOSIS — O99212 Obesity complicating pregnancy, second trimester: Secondary | ICD-10-CM | POA: Diagnosis not present

## 2019-04-24 DIAGNOSIS — O09299 Supervision of pregnancy with other poor reproductive or obstetric history, unspecified trimester: Secondary | ICD-10-CM

## 2019-04-24 DIAGNOSIS — O09529 Supervision of elderly multigravida, unspecified trimester: Secondary | ICD-10-CM

## 2019-04-24 DIAGNOSIS — O09212 Supervision of pregnancy with history of pre-term labor, second trimester: Secondary | ICD-10-CM

## 2019-04-24 DIAGNOSIS — Z3A26 26 weeks gestation of pregnancy: Secondary | ICD-10-CM

## 2019-04-24 DIAGNOSIS — O289 Unspecified abnormal findings on antenatal screening of mother: Secondary | ICD-10-CM

## 2019-04-24 DIAGNOSIS — Z362 Encounter for other antenatal screening follow-up: Secondary | ICD-10-CM

## 2019-04-24 DIAGNOSIS — O09292 Supervision of pregnancy with other poor reproductive or obstetric history, second trimester: Secondary | ICD-10-CM

## 2019-05-10 ENCOUNTER — Telehealth (INDEPENDENT_AMBULATORY_CARE_PROVIDER_SITE_OTHER): Payer: BC Managed Care – PPO

## 2019-05-10 ENCOUNTER — Other Ambulatory Visit: Payer: Self-pay

## 2019-05-10 VITALS — BP 119/72 | HR 79 | Wt 278.0 lb

## 2019-05-10 DIAGNOSIS — Z3480 Encounter for supervision of other normal pregnancy, unspecified trimester: Secondary | ICD-10-CM | POA: Diagnosis not present

## 2019-05-10 DIAGNOSIS — Z23 Encounter for immunization: Secondary | ICD-10-CM | POA: Diagnosis not present

## 2019-05-10 DIAGNOSIS — Z3A28 28 weeks gestation of pregnancy: Secondary | ICD-10-CM

## 2019-05-10 DIAGNOSIS — Z3483 Encounter for supervision of other normal pregnancy, third trimester: Secondary | ICD-10-CM

## 2019-05-10 NOTE — Progress Notes (Signed)
   TELEHEALTH VIRTUAL OBSTETRICS VISIT ENCOUNTER NOTE  I connected with Katherine [name] on 05/10/19 at  8:30 AM EDT by MyChart Virtual Visit at home and verified that I am speaking with the correct person using two identifiers.   I discussed the limitations, risks, security and privacy concerns of performing an evaluation and management service by telephone and the availability of in person appointments. I also discussed with the patient that there may be a patient responsible charge related to this service. The patient expressed understanding and agreed to proceed.  Subjective:  Katherine [name] is a 38 y.o. [phone] at [redacted]w[redacted]d being followed for ongoing prenatal care.  She is currently monitored for the following issues for this high-risk pregnancy and has GERD (gastroesophageal reflux disease); Obesity; Concentration deficit; Family history of DVT; Mass of breast, left; AMA (advanced maternal age) multigravida 61+; History of loop electrosurgical excision procedure (LEEP); Postpartum depression; Supervision of other normal pregnancy, antepartum; History of pre-eclampsia in prior pregnancy, currently pregnant; and Abnormal chromosomal and genetic finding on antenatal screening mother on their problem list.  Patient reports no complaints. Reports fetal movement. Denies any contractions, bleeding or leaking of fluid.   The following portions of the patient's history were reviewed and updated as appropriate: allergies, current medications, past family history, past medical history, past social history, past surgical history and problem list.   Objective:   General:  Alert, oriented and cooperative.   Mental Status: Normal mood and affect perceived. Normal judgment and thought content.  Rest of physical exam deferred due to type of encounter  Assessment and Plan:  Pregnancy: [id number] at [redacted]w[redacted]d 1. Supervision of other normal pregnancy, antepartum -BP 121/58 -Patient doing well without  complaints. -Reviewed BPs in Baby[facility] and all within normal range  -U/S on 6/24 normal with appropriate growth, follow up scheduled 7/24 -Patient states husband has been tested an is O negative. Does not need Rhogam. -Patient desires BTL postpartum.  - 2Hr GTT w/ 1 Hr Carpenter 75 g - CBC - HIV antibody (with reflex) - RPR - Antibody screen; Future - Antibody screen  Preterm labor symptoms and general obstetric precautions including but not limited to vaginal bleeding, contractions, leaking of fluid and fetal movement were reviewed in detail with the patient.  I discussed the assessment and treatment plan with the patient. The patient was provided an opportunity to ask questions and all were answered. The patient agreed with the plan and demonstrated an understanding of the instructions. The patient was advised to call back or seek an in-person office evaluation/go to MAU at [facility] for any urgent or concerning symptoms. Please refer to After Visit Summary for other counseling recommendations.   I provided 15 minutes of non-face-to-face time during this encounter.  Return in about 4 weeks (around 06/07/2019) for Return OB visit.  Future Appointments  Date Time Provider [place]  05/24/2019 10:30 AM [place] [facility]  05/24/2019 10:30 AM [facility] [place] 1 [facility]    Caroline M Neill, [place] for [employer], [place]

## 2019-05-13 LAB — 2HR GTT W 1 HR, CARPENTER, 75 G
Glucose, 1 Hr, Gest: 126 mg/dL (ref 65–179)
Glucose, 2 Hr, Gest: 87 mg/dL (ref 65–152)
Glucose, Fasting, Gest: 85 mg/dL (ref 65–91)

## 2019-05-13 LAB — CBC
HCT: 32.9 % — ABNORMAL LOW (ref 35.0–45.0)
Hemoglobin: 11.3 g/dL — ABNORMAL LOW (ref 11.7–15.5)
MCH: 32.8 pg (ref 27.0–33.0)
MCHC: 34.3 g/dL (ref 32.0–36.0)
MCV: 95.4 fL (ref 80.0–100.0)
MPV: 9.4 fL (ref 7.5–12.5)
Platelets: 293 10*3/uL (ref 140–400)
RBC: 3.45 10*6/uL — ABNORMAL LOW (ref 3.80–5.10)
RDW: 15 % (ref 11.0–15.0)
WBC: 8.1 10*3/uL (ref 3.8–10.8)

## 2019-05-13 LAB — RPR: RPR Ser Ql: NONREACTIVE

## 2019-05-13 LAB — ANTIBODY SCREEN: Antibody Screen: NOT DETECTED

## 2019-05-13 LAB — HIV ANTIBODY (ROUTINE TESTING W REFLEX): HIV 1&2 Ab, 4th Generation: NONREACTIVE

## 2019-05-24 ENCOUNTER — Other Ambulatory Visit: Payer: Self-pay

## 2019-05-24 ENCOUNTER — Other Ambulatory Visit (HOSPITAL_COMMUNITY): Payer: Self-pay | Admitting: *Deleted

## 2019-05-24 ENCOUNTER — Ambulatory Visit (HOSPITAL_COMMUNITY): Payer: BC Managed Care – PPO | Admitting: *Deleted

## 2019-05-24 ENCOUNTER — Ambulatory Visit (HOSPITAL_COMMUNITY)
Admission: RE | Admit: 2019-05-24 | Discharge: 2019-05-24 | Disposition: A | Discharge status: Home / Self Care | Payer: BC Managed Care – PPO | Source: Ambulatory Visit | Attending: Obstetrics and Gynecology | Admitting: Obstetrics and Gynecology

## 2019-05-24 ENCOUNTER — Encounter (HOSPITAL_COMMUNITY): Payer: Self-pay

## 2019-05-24 VITALS — BP 129/72 | HR 72 | Temp 98.1°F

## 2019-05-24 DIAGNOSIS — O09529 Supervision of elderly multigravida, unspecified trimester: Secondary | ICD-10-CM

## 2019-05-24 DIAGNOSIS — O99213 Obesity complicating pregnancy, third trimester: Secondary | ICD-10-CM

## 2019-05-24 DIAGNOSIS — O09523 Supervision of elderly multigravida, third trimester: Secondary | ICD-10-CM | POA: Diagnosis not present

## 2019-05-24 DIAGNOSIS — O09213 Supervision of pregnancy with history of pre-term labor, third trimester: Secondary | ICD-10-CM

## 2019-05-24 DIAGNOSIS — O289 Unspecified abnormal findings on antenatal screening of mother: Secondary | ICD-10-CM

## 2019-05-24 DIAGNOSIS — Z3A30 30 weeks gestation of pregnancy: Secondary | ICD-10-CM

## 2019-05-24 DIAGNOSIS — O09293 Supervision of pregnancy with other poor reproductive or obstetric history, third trimester: Secondary | ICD-10-CM

## 2019-05-24 DIAGNOSIS — Z362 Encounter for other antenatal screening follow-up: Secondary | ICD-10-CM

## 2019-06-07 ENCOUNTER — Telehealth (INDEPENDENT_AMBULATORY_CARE_PROVIDER_SITE_OTHER): Payer: BC Managed Care – PPO

## 2019-06-07 ENCOUNTER — Encounter (HOSPITAL_COMMUNITY): Payer: Self-pay

## 2019-06-07 ENCOUNTER — Inpatient Hospital Stay (HOSPITAL_COMMUNITY)
Admission: AD | Admit: 2019-06-07 | Discharge: 2019-06-07 | Disposition: A | Discharge status: Home / Self Care | Payer: BC Managed Care – PPO | Attending: Obstetrics & Gynecology | Admitting: Obstetrics & Gynecology

## 2019-06-07 ENCOUNTER — Other Ambulatory Visit: Payer: Self-pay

## 2019-06-07 DIAGNOSIS — O09293 Supervision of pregnancy with other poor reproductive or obstetric history, third trimester: Secondary | ICD-10-CM

## 2019-06-07 DIAGNOSIS — Z3480 Encounter for supervision of other normal pregnancy, unspecified trimester: Secondary | ICD-10-CM

## 2019-06-07 DIAGNOSIS — O26893 Other specified pregnancy related conditions, third trimester: Secondary | ICD-10-CM | POA: Diagnosis not present

## 2019-06-07 DIAGNOSIS — O09299 Supervision of pregnancy with other poor reproductive or obstetric history, unspecified trimester: Secondary | ICD-10-CM

## 2019-06-07 DIAGNOSIS — Z3A32 32 weeks gestation of pregnancy: Secondary | ICD-10-CM | POA: Insufficient documentation

## 2019-06-07 DIAGNOSIS — R030 Elevated blood-pressure reading, without diagnosis of hypertension: Secondary | ICD-10-CM | POA: Diagnosis not present

## 2019-06-07 HISTORY — DX: Depression, unspecified: F32.A

## 2019-06-07 LAB — CBC
HCT: 36.2 % (ref 36.0–46.0)
Hemoglobin: 12.3 g/dL (ref 12.0–15.0)
MCH: 33.1 pg (ref 26.0–34.0)
MCHC: 34 g/dL (ref 30.0–36.0)
MCV: 97.3 fL (ref 80.0–100.0)
Platelets: 316 10*3/uL (ref 150–400)
RBC: 3.72 MIL/uL — ABNORMAL LOW (ref 3.87–5.11)
RDW: 15.9 % — ABNORMAL HIGH (ref 11.5–15.5)
WBC: 10.4 10*3/uL (ref 4.0–10.5)
nRBC: 0 % (ref 0.0–0.2)

## 2019-06-07 LAB — COMPREHENSIVE METABOLIC PANEL
ALT: 12 U/L (ref 0–44)
AST: 15 U/L (ref 15–41)
Albumin: 2.8 g/dL — ABNORMAL LOW (ref 3.5–5.0)
Alkaline Phosphatase: 272 U/L — ABNORMAL HIGH (ref 38–126)
Anion gap: 10 (ref 5–15)
BUN: 5 mg/dL — ABNORMAL LOW (ref 6–20)
CO2: 23 mmol/L (ref 22–32)
Calcium: 8.9 mg/dL (ref 8.9–10.3)
Chloride: 100 mmol/L (ref 98–111)
Creatinine, Ser: 0.58 mg/dL (ref 0.44–1.00)
GFR calc Af Amer: >60 mL/min (ref >60)
GFR calc non Af Amer: >60 mL/min (ref >60)
Glucose, Bld: 90 mg/dL (ref 70–99)
Potassium: 3.5 mmol/L (ref 3.5–5.1)
Sodium: 133 mmol/L — ABNORMAL LOW (ref 135–145)
Total Bilirubin: 0.3 mg/dL (ref 0.3–1.2)
Total Protein: 6.6 g/dL (ref 6.5–8.1)

## 2019-06-07 LAB — URINALYSIS, ROUTINE W REFLEX MICROSCOPIC
Bilirubin Urine: NEGATIVE
Glucose, UA: NEGATIVE mg/dL
Ketones, ur: NEGATIVE mg/dL
Leukocytes,Ua: NEGATIVE
Nitrite: NEGATIVE
Protein, ur: 30 mg/dL — AB
Specific Gravity, Urine: 1.021 (ref 1.005–1.030)
pH: 6 (ref 5.0–8.0)

## 2019-06-07 LAB — PROTEIN / CREATININE RATIO, URINE
Creatinine, Urine: 187.55 mg/dL
Protein Creatinine Ratio: 0.21 mg/mg{Cre} — ABNORMAL HIGH (ref 0.00–0.15)
Total Protein, Urine: 40 mg/dL

## 2019-06-07 NOTE — MAU Provider Note (Signed)
Pt had Prenatal video visit today and BP 144/84. Yesterday 132/76.  Denies VB, LOF, +FM. Hx of pre-e w/previous baby. Len Blalock sent for NST and Pre-e labs. Denies HA., blurry vision, edema.

## 2019-06-07 NOTE — Progress Notes (Signed)
   TELEHEALTH VIRTUAL OBSTETRICS VISIT ENCOUNTER NOTE  I connected with Katherine [name] on 06/07/19 at  9:00 AM EDT by My Chart Virtual Visit at home and verified that I am speaking with the correct person using two identifiers.   I discussed the limitations, risks, security and privacy concerns of performing an evaluation and management service by telephone and the availability of in person appointments. I also discussed with the patient that there may be a patient responsible charge related to this service. The patient expressed understanding and agreed to proceed.  Subjective:  Katherine [name] is a 38 y.o. [phone] at 104w3d being followed for ongoing prenatal care.  She is currently monitored for the following issues for this high-risk pregnancy and has GERD (gastroesophageal reflux disease); Obesity; Concentration deficit; Family history of DVT; Mass of breast, left; AMA (advanced maternal age) multigravida 38+; History of loop electrosurgical excision procedure (LEEP); Postpartum depression; Supervision of other normal pregnancy, antepartum; History of pre-eclampsia in prior pregnancy, currently pregnant; and Abnormal chromosomal and genetic finding on antenatal screening mother on their problem list.  Patient reports headache. Reports fetal movement. Denies any contractions, bleeding or leaking of fluid.   The following portions of the patient's history were reviewed and updated as appropriate: allergies, current medications, past family history, past medical history, past social history, past surgical history and problem list.   Objective:   General:  Alert, oriented and cooperative.   Mental Status: Normal mood and affect perceived. Normal judgment and thought content.  Rest of physical exam deferred due to type of encounter  Assessment and Plan:  Pregnancy: [id number] at [redacted]w[redacted]d 1. Supervision of other normal pregnancy, antepartum -BP 144/83. Blood pressures gradually going up  each week. Patient very concerned due to hx of preeclampsia with sudden onset in previous pregnancy. Patient reports a HA that she rates a 4/10 but states it is her normal morning headache because she grinds her teeth at night. Denies visual changes or epigastric pain. Discussed with patient the need to go to MAU for evaluation due to new onset HTN and HA. Patient agreeable to plan of care. Altamease Oiler NP notified.  -Will have patient come in person to office in 2 weeks for ROB or sooner if needed after MAU visit.   2. History of pre-eclampsia in prior pregnancy, currently pregnant   Preterm labor symptoms and general obstetric precautions including but not limited to vaginal bleeding, contractions, leaking of fluid and fetal movement were reviewed in detail with the patient.  I discussed the assessment and treatment plan with the patient. The patient was provided an opportunity to ask questions and all were answered. The patient agreed with the plan and demonstrated an understanding of the instructions. The patient was advised to call back or seek an in-person office evaluation/go to MAU at [facility] for any urgent or concerning symptoms. Please refer to After Visit Summary for other counseling recommendations.   I provided 25 minutes of non-face-to-face time during this encounter.  Return in about 2 weeks (around 06/21/2019) for Return OB visit.  Future Appointments  Date Time Provider [place]  06/21/2019  9:45 AM [place] [facility]  06/21/2019  9:45 AM [place] [facility]  06/24/2019  9:45 AM Emily Filbert, MD [facility]  07/04/2019  1:15 PM Emily Filbert, MD [place], [place] for [employer], [place]

## 2019-06-07 NOTE — Discharge Instructions (Signed)
Katherine [name] Patient Name: ________________________________________________ Patient Due Date: ____________________ What is a Katherine movement count?  A Katherine movement count is the number of times that you feel your baby move during a certain amount of time. This may also be called a Katherine kick count. A Katherine movement count is recommended for every pregnant woman. You may be asked to start counting Katherine movements as early as week 28 of your pregnancy. Pay attention to when your baby is most active. You may notice your baby's sleep and wake cycles. You may also notice things that make your baby move more. You should do a Katherine movement count:  When your baby is normally most active.  At the same time each day. A good time to count movements is while you are resting, after having something to eat and drink. How do I count Katherine movements? 1. Find a quiet, comfortable area. Sit, or lie down on your side. 2. Write down the date, the start time and stop time, and the number of movements that you felt between those two times. Take this information with you to your health care visits. 3. For 2 hours, count kicks, flutters, swishes, rolls, and jabs. You should feel at least 10 movements during 2 hours. 4. You may stop counting after you have felt 10 movements. 5. If you do not feel 10 movements in 2 hours, have something to eat and drink. Then, keep resting and counting for 1 hour. If you feel at least 4 movements during that hour, you may stop counting. Contact a health care provider if:  You feel fewer than 4 movements in 2 hours.  Your baby is not moving like he or she usually does. Date: ____________ Start time: ____________ Stop time: ____________ Movements: ____________ Date: ____________ Start time: ____________ Stop time: ____________ Movements: ____________ Date: ____________ Start time: ____________ Stop time: ____________ Movements: ____________ Date: ____________ Start time:  ____________ Stop time: ____________ Movements: ____________ Date: ____________ Start time: ____________ Stop time: ____________ Movements: ____________ Date: ____________ Start time: ____________ Stop time: ____________ Movements: ____________ Date: ____________ Start time: ____________ Stop time: ____________ Movements: ____________ Date: ____________ Start time: ____________ Stop time: ____________ Movements: ____________ Date: ____________ Start time: ____________ Stop time: ____________ Movements: ____________ This information is not intended to replace advice given to you by your health care provider. Make sure you discuss any questions you have with your health care provider. Document Released: 11/16/2006 Document Revised: 11/06/2018 Document Reviewed: 11/26/2015 Elsevier Patient Education  2020 [place]. Hypertension During Pregnancy High blood pressure (hypertension) is when the force of blood pumping through the arteries is too strong. Arteries are blood vessels that carry blood from the heart throughout the body. Hypertension during pregnancy can be mild or severe. Severe hypertension during pregnancy (preeclampsia) is a medical emergency that requires prompt evaluation and treatment. Different types of hypertension can happen during pregnancy. These include:  Chronic hypertension. This happens when you had high blood pressure before you became pregnant, and it continues during the pregnancy. Hypertension that develops before you are [redacted] weeks pregnant and continues during the pregnancy is also called chronic hypertension. If you have chronic hypertension, it will not go away after you have your baby. You will need follow-up visits with your health care provider after you have your baby. Your doctor may want you to keep taking medicine for your blood pressure.  Gestational hypertension. This is hypertension that develops after the 20th week of pregnancy. Gestational hypertension usually  goes away after  you have your baby, but your health care provider will need to monitor your blood pressure to make sure that it is getting better.  Preeclampsia. This is severe hypertension during pregnancy. This can cause serious complications for you and your baby and can also cause complications for you after the delivery of your baby.  Postpartum preeclampsia. You may develop severe hypertension after giving birth. This usually occurs within 48 hours after childbirth but may occur up to 6 weeks after giving birth. This is rare. How does this affect me? Women who have hypertension during pregnancy have a greater chance of developing hypertension later in life or during future pregnancies. In some cases, hypertension during pregnancy can cause serious complications, such as:  Stroke.  Heart attack.  Injury to other organs, such as kidneys, lungs, or liver.  Preeclampsia.  Convulsions or seizures.  Placental abruption. How does this affect my baby? Hypertension during pregnancy can affect your baby. Your baby may:  Be born early (prematurely).  Not weigh as much as he or she should at birth (low birth weight).  Not tolerate labor well, leading to an unplanned cesarean delivery. What are the risks? There are certain factors that make it more likely for you to develop hypertension during pregnancy. These include:  Having hypertension during a previous pregnancy.  Being overweight.  Being age 60 or older.  Being pregnant for the first time.  Being pregnant with more than one baby.  Becoming pregnant using fertilization methods, such as IVF (in vitro fertilization).  Having other medical problems, such as diabetes, kidney disease, or lupus.  Having a family history of hypertension. What can I do to lower my risk? The exact cause of hypertension during pregnancy is not known. You may be able to lower your risk by:  Maintaining a healthy weight.  Eating a healthy and  balanced diet.  Following your health care provider's instructions about treating any long-term conditions that you had before becoming pregnant. It is very important to keep all of your prenatal care appointments. Your health care provider will check your blood pressure and make sure that your pregnancy is progressing as expected. If a problem is found, early treatment can prevent complications. How is this treated? Treatment for hypertension during pregnancy varies depending on the type of hypertension you have and how serious it is.  If you were taking medicine for high blood pressure before you became pregnant, talk with your health care provider. You may need to change medicine during pregnancy because some medicines, like ACE inhibitors, may not be considered safe for your baby.  If you have gestational hypertension, your health care provider may order medicine to treat this during pregnancy.  If you are at risk for preeclampsia, your health care provider may recommend that you take a low-dose aspirin during your pregnancy.  If you have severe hypertension, you may need to be hospitalized so you and your baby can be monitored closely. You may also need to be given medicine to lower your blood pressure. This medicine may be given by mouth or through an IV.  In some cases, if your condition gets worse, you may need to deliver your baby early. Follow these instructions at home: Eating and drinking   Drink enough fluid to keep your urine pale yellow.  Avoid caffeine. Lifestyle  Do not use any products that contain nicotine or tobacco, such as cigarettes, e-cigarettes, and chewing tobacco. If you need help quitting, ask your health care provider.  Do  not use alcohol or drugs.  Avoid stress as much as possible.  Rest and get plenty of sleep.  Regular exercise can help to reduce your blood pressure. Ask your health care provider what kinds of exercise are best for you. General  instructions  Take over-the-counter and prescription medicines only as told by your health care provider.  Keep all prenatal and follow-up visits as told by your health care provider. This is important. Contact a health care provider if:  You have symptoms that your health care provider told you may require more treatment or monitoring, such as: ? Headaches. ? Nausea or vomiting. ? Abdominal pain. ? Dizziness. ? Light-headedness. Get help right away if:  You have: ? Severe abdominal pain that does not get better with treatment. ? A severe headache that does not get better. ? Vomiting that does not get better. ? Sudden, rapid weight gain. ? Sudden swelling in your hands, ankles, or face. ? Vaginal bleeding. ? Blood in your urine. ? Blurred or double vision. ? Shortness of breath or chest pain. ? Weakness on one side of your body. ? Difficulty speaking.  Your baby is not moving as much as usual. Summary  High blood pressure (hypertension) is when the force of blood pumping through the arteries is too strong.  Hypertension during pregnancy can cause problems for you and your baby.  Treatment for hypertension during pregnancy varies depending on the type of hypertension you have and how serious it is.  Keep all prenatal and follow-up visits as told by your health care provider. This is important. This information is not intended to replace advice given to you by your health care provider. Make sure you discuss any questions you have with your health care provider. Document Released: 07/05/2011 Document Revised: 02/07/2019 Document Reviewed: 11/13/2018 Elsevier Patient Education  2020 [employer].

## 2019-06-07 NOTE — Progress Notes (Signed)
Needs to sign BTL soon!

## 2019-06-07 NOTE — MAU Provider Note (Signed)
Chief Complaint  Patient presents with  . Hypertension     First Provider Initiated Contact with Patient 06/07/19 1809      S: Katherine [name]  is a 38 y.o. y.o. year old [id number] female at 6875w3d weeks gestation who presents to MAU with elevated blood pressures. Hx of preeclampsia during her last pregnancy. Current blood pressure medication: none. States her BPs have been trending up over the last week. BP was elevated during her virtual OB visit today so she was referred to MAU for evaluation. Had headache earlier today which resolved without medication.  Denies visual disturbance or epigastric pain. No contractions, LOF, or vaginal bleeding. Good fetal movement.   O:  Patient Vitals for the past 24 hrs:  BP Temp Temp src Pulse Resp  06/07/19 1937 - 97.9 F (36.6 C) Oral - 16  06/07/19 1831 134/69 - - 84 -  06/07/19 1816 133/74 - - 80 -  06/07/19 1801 136/72 - - 85 -  06/07/19 1751 132/73 - - 83 -  06/07/19 1719 126/70 98.3 F (36.8 C) Oral 93 -   General: NAD Heart: Regular rate Lungs: Normal rate and effort Abd: Soft, NT, Gravid, S=D Extremities: non pitting Pedal edema Neuro: 2+ deep tendon reflexes, No clonus  NST:  Baseline: 140 bpm, Variability: Good {> 6 bpm), Accelerations: Reactive and Decelerations: Absent  Results for orders placed or performed during the hospital encounter of 06/07/19 (from the past 24 hour(s))  Urinalysis, Routine w reflex microscopic     Status: Abnormal   Collection Time: 06/07/19  5:30 PM  Result Value Ref Range   Color, Urine YELLOW YELLOW   APPearance CLEAR CLEAR   Specific Gravity, Urine 1.021 1.005 - 1.030   pH 6.0 5.0 - 8.0   Glucose, UA NEGATIVE NEGATIVE mg/dL   Hgb urine dipstick SMALL (A) NEGATIVE   Bilirubin Urine NEGATIVE NEGATIVE   Ketones, ur NEGATIVE NEGATIVE mg/dL   Protein, ur 30 (A) NEGATIVE mg/dL   Nitrite NEGATIVE NEGATIVE   Leukocytes,Ua NEGATIVE NEGATIVE   RBC / HPF 0-5 0 - 5 RBC/hpf   WBC, UA 0-5 0 - 5 WBC/hpf    Bacteria, UA RARE (A) NONE SEEN   Squamous Epithelial / LPF 0-5 0 - 5   Mucus PRESENT   CBC     Status: Abnormal   Collection Time: 06/07/19  5:32 PM  Result Value Ref Range   WBC 10.4 4.0 - 10.5 K/uL   RBC 3.72 (L) 3.87 - 5.11 MIL/uL   Hemoglobin 12.3 12.0 - 15.0 g/dL   HCT 16.136.2 09.636.0 - 04.546.0 %   MCV 97.3 80.0 - 100.0 fL   MCH 33.1 26.0 - 34.0 pg   MCHC 34.0 30.0 - 36.0 g/dL   RDW 40.915.9 (H) 81.111.5 - 91.415.5 %   Platelets 316 150 - 400 K/uL   nRBC 0.0 0.0 - 0.2 %  Comprehensive metabolic panel     Status: Abnormal   Collection Time: 06/07/19  5:32 PM  Result Value Ref Range   Sodium 133 (L) 135 - 145 mmol/L   Potassium 3.5 3.5 - 5.1 mmol/L   Chloride 100 98 - 111 mmol/L   CO2 23 22 - 32 mmol/L   Glucose, Bld 90 70 - 99 mg/dL   BUN 5 (L) 6 - 20 mg/dL   Creatinine, Ser 7.820.58 0.44 - 1.00 mg/dL   Calcium 8.9 8.9 - 95.610.3 mg/dL   Total Protein 6.6 6.5 - 8.1 g/dL   Albumin 2.8 (L)  3.5 - 5.0 g/dL   AST 15 15 - 41 U/L   ALT 12 0 - 44 U/L   Alkaline Phosphatase 272 (H) 38 - 126 U/L   Total Bilirubin 0.3 0.3 - 1.2 mg/dL   GFR calc non Af Amer >60 >60 mL/min   GFR calc Af Amer >60 >60 mL/min   Anion gap 10 5 - 15  Protein / creatinine ratio, urine     Status: Abnormal   Collection Time: 06/07/19  5:54 PM  Result Value Ref Range   Creatinine, Urine 187.55 mg/dL   Total Protein, Urine 40 mg/dL   Protein Creatinine Ratio 0.21 (H) 0.00 - 0.15 mg/mg[Cre]    A:  1. Elevated BP without diagnosis of hypertension  -pt normotensive today in MAU -asymptomatic -PEC labs negative  2. History of pre-eclampsia in prior pregnancy, currently pregnant   3. [redacted] weeks gestation of pregnancy      P:  Discharge home in stable condition Discussed reasons to return to MAU Msg to [facility] for BP check on Monday  Jorje Guild, NP 06/07/2019 7:43 PM

## 2019-06-11 ENCOUNTER — Encounter: Payer: Self-pay | Admitting: Certified Nurse Midwife

## 2019-06-11 ENCOUNTER — Other Ambulatory Visit: Payer: Self-pay

## 2019-06-11 ENCOUNTER — Ambulatory Visit (INDEPENDENT_AMBULATORY_CARE_PROVIDER_SITE_OTHER): Payer: BC Managed Care – PPO | Admitting: Certified Nurse Midwife

## 2019-06-11 DIAGNOSIS — O09293 Supervision of pregnancy with other poor reproductive or obstetric history, third trimester: Secondary | ICD-10-CM

## 2019-06-11 DIAGNOSIS — O285 Abnormal chromosomal and genetic finding on antenatal screening of mother: Secondary | ICD-10-CM

## 2019-06-11 DIAGNOSIS — O09299 Supervision of pregnancy with other poor reproductive or obstetric history, unspecified trimester: Secondary | ICD-10-CM

## 2019-06-11 DIAGNOSIS — Z3A33 33 weeks gestation of pregnancy: Secondary | ICD-10-CM

## 2019-06-11 DIAGNOSIS — Z3480 Encounter for supervision of other normal pregnancy, unspecified trimester: Secondary | ICD-10-CM

## 2019-06-11 NOTE — Progress Notes (Signed)
Pt denies headache today. PT had headache yesterday.

## 2019-06-11 NOTE — Progress Notes (Signed)
S: Katherine [name]  is a 38 y.o. y.o. year old G12P1113 female at [redacted]w[redacted]d weeks gestation who presents to office for BP check after being seen in MAU on 06/07/2019. Hx of PEC in previous pregnancy. Current blood pressure medication: none  She denies HA, vision changes or epigastric pain today. Reports having a HA yesterday that went away with rest- no medication needed.  O:  Blood pressure 126/86, pulse 82, weight 290 lb (131.5 kg).  General: NAD Heart: Regular rate Lungs: Normal rate and effort Abd: Soft, NT, Gravid, S=D Extremities: Trace edema Neuro: 2+ deep tendon reflexes, No clonus   FHR 143 by doppler    A: [redacted]w[redacted]d week IUP Normotensive in office   P:  Preeclampsia precautions.. Educated and discussed BP monitoring at home and how to take BP Encouraged to continue to enter BP into babyscripts app  PEC precautions reviewed  Hydration and FKC  Discussed reasons to go to MAU for evaluation  Follow up as scheduled for prenatal appointments   Lajean Manes, CNM 06/11/2019 1:18 PM

## 2019-06-11 NOTE — Patient Instructions (Signed)
Hypertension During Pregnancy °Hypertension is also called high blood pressure. High blood pressure means that the force of your blood moving in your body is too strong. It can cause problems for you and your baby. Different types of high blood pressure can happen during pregnancy. The types are: °· High blood pressure before you got pregnant. This is called chronic hypertension.  This can continue during your pregnancy. Your doctor will want to keep checking your blood pressure. You may need medicine to keep your blood pressure under control while you are pregnant. You will need follow-up visits after you have your baby. °· High blood pressure that goes up during pregnancy when it was normal before. This is called gestational hypertension. It will usually get better after you have your baby, but your doctor will need to watch your blood pressure to make sure that it is getting better. °· Very high blood pressure during pregnancy. This is called preeclampsia. Very high blood pressure is an emergency that needs to be checked and treated right away. °· You may develop very high blood pressure after giving birth. This is called postpartum preeclampsia. This usually occurs within 48 hours after childbirth but may occur up to 6 weeks after giving birth. This is rare. °How does this affect me? °If you have high blood pressure during pregnancy, you have a higher chance of developing high blood pressure: °· As you get older. °· If you get pregnant again. °In some cases, high blood pressure during pregnancy can cause: °· Stroke. °· Heart attack. °· Damage to the kidneys, lungs, or liver. °· Preeclampsia. °· Jerky movements you cannot control (convulsions or seizures). °· Problems with the placenta. °How does this affect my baby? °Your baby may: °· Be born early. °· Not weigh as much as he or she should. °· Not handle labor well, leading to a c-section birth. °What are the risks? °· Having high blood pressure during a past  pregnancy. °· Being overweight. °· Being 40 years old or older. °· Being pregnant for the first time. °· Being pregnant with more than one baby. °· Becoming pregnant using fertility methods, such as IVF. °· Having other problems, such as diabetes, or kidney disease. °· Having family members who have high blood pressure. °What can I do to lower my risk? ° °· Keep a healthy weight. °· Eat a healthy diet. °· Follow what your doctor tells you about treating any medical problems that you had before becoming pregnant. °It is very important to go to all of your doctor visits. Your doctor will check your blood pressure and make sure that your pregnancy is progressing as it should. Treatment should start early if a problem is found. °How is this treated? °Treatment for high blood pressure during pregnancy can differ depending on the type of high blood pressure you have and how serious it is. °· You may need to take blood pressure medicine. °· If you have been taking medicine for your blood pressure, you may need to change the medicine during pregnancy if it is not safe for your baby. °· If your doctor thinks that you could get very high blood pressure, he or she may tell you to take a low-dose aspirin during your pregnancy. °· If you have very high blood pressure, you may need to stay in the hospital so you and your baby can be watched closely. You may also need to take medicine to lower your blood pressure. This medicine may be given by mouth   or through an IV tube. °· In some cases, if your condition gets worse, you may need to have your baby early. °Follow these instructions at home: °Eating and drinking ° °· Drink enough fluid to keep your pee (urine) pale yellow. °· Avoid caffeine. °Lifestyle °· Do not use any products that contain nicotine or tobacco, such as cigarettes, e-cigarettes, and chewing tobacco. If you need help quitting, ask your doctor. °· Do not use alcohol or drugs. °· Avoid stress. °· Rest and get plenty  of sleep. °· Regular exercise can help. Ask your doctor what kinds of exercise are best for you. °General instructions °· Take over-the-counter and prescription medicines only as told by your doctor. °· Keep all prenatal and follow-up visits as told by your doctor. This is important. °Contact a doctor if: °· You have symptoms that your doctor told you to watch for, such as: °? Headaches. °? Nausea. °? Vomiting. °? Belly (abdominal) pain. °? Dizziness. °? Light-headedness. °Get help right away if: °· You have: °? Very bad belly pain that does not get better with treatment. °? A very bad headache that does not get better. °? Vomiting that does not get better. °? Sudden, fast weight gain. °? Sudden swelling in your hands, ankles, or face. °? Bleeding from your vagina. °? Blood in your pee. °? Blurry vision. °? Double vision. °? Shortness of breath. °? Chest pain. °? Weakness on one side of your body. °? Trouble talking. °· Your baby is not moving as much as usual. °Summary °· High blood pressure is also called hypertension. °· High blood pressure means that the force of your blood moving in your body is too strong. °· High blood pressure can cause problems for you and your baby. °· Keep all follow-up visits as told by your doctor. This is important. °This information is not intended to replace advice given to you by your health care provider. Make sure you discuss any questions you have with your health care provider. °Document Released: 11/19/2010 Document Revised: 02/07/2019 Document Reviewed: 11/13/2018 °Elsevier Patient Education © 2020 [employer] ° °

## 2019-06-15 ENCOUNTER — Other Ambulatory Visit: Payer: Self-pay

## 2019-06-15 ENCOUNTER — Encounter (HOSPITAL_COMMUNITY): Payer: Self-pay

## 2019-06-15 ENCOUNTER — Inpatient Hospital Stay (HOSPITAL_COMMUNITY)
Admission: AD | Admit: 2019-06-15 | Discharge: 2019-06-20 | DRG: 785 | Disposition: A | Discharge status: Home / Self Care | Payer: BC Managed Care – PPO | Attending: Family Medicine | Admitting: Family Medicine

## 2019-06-15 DIAGNOSIS — O133 Gestational [pregnancy-induced] hypertension without significant proteinuria, third trimester: Secondary | ICD-10-CM | POA: Diagnosis not present

## 2019-06-15 DIAGNOSIS — O134 Gestational [pregnancy-induced] hypertension without significant proteinuria, complicating childbirth: Secondary | ICD-10-CM | POA: Diagnosis not present

## 2019-06-15 DIAGNOSIS — O42919 Preterm premature rupture of membranes, unspecified as to length of time between rupture and onset of labor, unspecified trimester: Secondary | ICD-10-CM | POA: Diagnosis present

## 2019-06-15 DIAGNOSIS — O42913 Preterm premature rupture of membranes, unspecified as to length of time between rupture and onset of labor, third trimester: Principal | ICD-10-CM

## 2019-06-15 DIAGNOSIS — O320XX0 Maternal care for unstable lie, not applicable or unspecified: Secondary | ICD-10-CM | POA: Diagnosis not present

## 2019-06-15 DIAGNOSIS — O26893 Other specified pregnancy related conditions, third trimester: Secondary | ICD-10-CM | POA: Diagnosis present

## 2019-06-15 DIAGNOSIS — Z87891 Personal history of nicotine dependence: Secondary | ICD-10-CM | POA: Diagnosis not present

## 2019-06-15 DIAGNOSIS — Z302 Encounter for sterilization: Secondary | ICD-10-CM

## 2019-06-15 DIAGNOSIS — O285 Abnormal chromosomal and genetic finding on antenatal screening of mother: Secondary | ICD-10-CM

## 2019-06-15 DIAGNOSIS — Z3A33 33 weeks gestation of pregnancy: Secondary | ICD-10-CM | POA: Diagnosis not present

## 2019-06-15 DIAGNOSIS — O328XX0 Maternal care for other malpresentation of fetus, not applicable or unspecified: Secondary | ICD-10-CM | POA: Diagnosis not present

## 2019-06-15 DIAGNOSIS — O4200 Premature rupture of membranes, onset of labor within 24 hours of rupture, unspecified weeks of gestation: Secondary | ICD-10-CM

## 2019-06-15 DIAGNOSIS — Z3A34 34 weeks gestation of pregnancy: Secondary | ICD-10-CM | POA: Diagnosis not present

## 2019-06-15 DIAGNOSIS — Z20828 Contact with and (suspected) exposure to other viral communicable diseases: Secondary | ICD-10-CM | POA: Diagnosis not present

## 2019-06-15 DIAGNOSIS — Z6791 Unspecified blood type, Rh negative: Secondary | ICD-10-CM | POA: Diagnosis not present

## 2019-06-15 DIAGNOSIS — O1493 Unspecified pre-eclampsia, third trimester: Secondary | ICD-10-CM | POA: Diagnosis present

## 2019-06-15 DIAGNOSIS — O1404 Mild to moderate pre-eclampsia, complicating childbirth: Secondary | ICD-10-CM | POA: Diagnosis present

## 2019-06-15 DIAGNOSIS — O26899 Other specified pregnancy related conditions, unspecified trimester: Secondary | ICD-10-CM

## 2019-06-15 DIAGNOSIS — O09299 Supervision of pregnancy with other poor reproductive or obstetric history, unspecified trimester: Secondary | ICD-10-CM

## 2019-06-15 DIAGNOSIS — O42013 Preterm premature rupture of membranes, onset of labor within 24 hours of rupture, third trimester: Secondary | ICD-10-CM | POA: Diagnosis not present

## 2019-06-15 DIAGNOSIS — O321XX0 Maternal care for breech presentation, not applicable or unspecified: Secondary | ICD-10-CM | POA: Diagnosis not present

## 2019-06-15 DIAGNOSIS — O43813 Placental infarction, third trimester: Secondary | ICD-10-CM | POA: Diagnosis not present

## 2019-06-15 DIAGNOSIS — Z98891 History of uterine scar from previous surgery: Secondary | ICD-10-CM

## 2019-06-15 DIAGNOSIS — Z3480 Encounter for supervision of other normal pregnancy, unspecified trimester: Secondary | ICD-10-CM

## 2019-06-15 DIAGNOSIS — O99214 Obesity complicating childbirth: Secondary | ICD-10-CM | POA: Diagnosis present

## 2019-06-15 DIAGNOSIS — O09529 Supervision of elderly multigravida, unspecified trimester: Secondary | ICD-10-CM

## 2019-06-15 LAB — SARS CORONAVIRUS 2 BY RT PCR (HOSPITAL ORDER, PERFORMED IN ~~LOC~~ HOSPITAL LAB): SARS Coronavirus 2: NEGATIVE

## 2019-06-15 LAB — CBC
HCT: 33.2 % — ABNORMAL LOW (ref 36.0–46.0)
Hemoglobin: 11.1 g/dL — ABNORMAL LOW (ref 12.0–15.0)
MCH: 32.9 pg (ref 26.0–34.0)
MCHC: 33.4 g/dL (ref 30.0–36.0)
MCV: 98.5 fL (ref 80.0–100.0)
Platelets: 302 10*3/uL (ref 150–400)
RBC: 3.37 MIL/uL — ABNORMAL LOW (ref 3.87–5.11)
RDW: 15.7 % — ABNORMAL HIGH (ref 11.5–15.5)
WBC: 8.7 10*3/uL (ref 4.0–10.5)
nRBC: 0.2 % (ref 0.0–0.2)

## 2019-06-15 LAB — COMPREHENSIVE METABOLIC PANEL WITH GFR
ALT: 13 U/L (ref 0–44)
AST: 16 U/L (ref 15–41)
Albumin: 2.5 g/dL — ABNORMAL LOW (ref 3.5–5.0)
Alkaline Phosphatase: 258 U/L — ABNORMAL HIGH (ref 38–126)
Anion gap: 12 (ref 5–15)
BUN: 5 mg/dL — ABNORMAL LOW (ref 6–20)
CO2: 19 mmol/L — ABNORMAL LOW (ref 22–32)
Calcium: 8.5 mg/dL — ABNORMAL LOW (ref 8.9–10.3)
Chloride: 106 mmol/L (ref 98–111)
Creatinine, Ser: 0.64 mg/dL (ref 0.44–1.00)
GFR calc Af Amer: >60 mL/min
GFR calc non Af Amer: >60 mL/min
Glucose, Bld: 99 mg/dL (ref 70–99)
Potassium: 3.6 mmol/L (ref 3.5–5.1)
Sodium: 137 mmol/L (ref 135–145)
Total Bilirubin: 0.3 mg/dL (ref 0.3–1.2)
Total Protein: 5.7 g/dL — ABNORMAL LOW (ref 6.5–8.1)

## 2019-06-15 LAB — URINALYSIS, ROUTINE W REFLEX MICROSCOPIC
Bilirubin Urine: NEGATIVE
Glucose, UA: NEGATIVE mg/dL
Hgb urine dipstick: NEGATIVE
Ketones, ur: NEGATIVE mg/dL
Leukocytes,Ua: NEGATIVE
Nitrite: NEGATIVE
Protein, ur: 30 mg/dL — AB
Specific Gravity, Urine: 1.017 (ref 1.005–1.030)
pH: 6 (ref 5.0–8.0)

## 2019-06-15 LAB — TYPE AND SCREEN
ABO/RH(D): O NEG
Antibody Screen: NEGATIVE

## 2019-06-15 LAB — PROTEIN / CREATININE RATIO, URINE
Creatinine, Urine: 55.02 mg/dL
Protein Creatinine Ratio: 0.33 mg/mg{Cre} — ABNORMAL HIGH (ref 0.00–0.15)
Total Protein, Urine: 18 mg/dL

## 2019-06-15 LAB — ABO/RH: ABO/RH(D): O NEG

## 2019-06-15 MED ORDER — LACTATED RINGERS IV SOLN
INTRAVENOUS | Status: DC
  Administered 2019-06-15 – 2019-06-16 (×2): via INTRAVENOUS

## 2019-06-15 MED ORDER — SODIUM CHLORIDE 0.9 % IV SOLN
250.0000 mg | Freq: Four times a day (QID) | INTRAVENOUS | Status: AC
  Administered 2019-06-15 – 2019-06-17 (×8): 250 mg via INTRAVENOUS
  Filled 2019-06-15 (×10): qty 5

## 2019-06-15 MED ORDER — SODIUM CHLORIDE 0.9 % IV SOLN
2.0000 g | Freq: Four times a day (QID) | INTRAVENOUS | Status: AC
  Administered 2019-06-15 – 2019-06-17 (×8): 2 g via INTRAVENOUS
  Filled 2019-06-15 (×9): qty 2000

## 2019-06-15 MED ORDER — BETAMETHASONE SOD PHOS & ACET 6 (3-3) MG/ML IJ SUSP
12.0000 mg | INTRAMUSCULAR | Status: AC
  Administered 2019-06-15 – 2019-06-16 (×2): 12 mg via INTRAMUSCULAR
  Filled 2019-06-15 (×2): qty 2

## 2019-06-15 MED ORDER — PRENATAL MULTIVITAMIN CH
1.0000 | ORAL_TABLET | Freq: Every day | ORAL | Status: DC
  Administered 2019-06-16 – 2019-06-17 (×2): 1 via ORAL
  Filled 2019-06-15 (×2): qty 1

## 2019-06-15 MED ORDER — ERYTHROMYCIN BASE 250 MG PO TBEC
250.0000 mg | DELAYED_RELEASE_TABLET | Freq: Four times a day (QID) | ORAL | Status: DC
  Administered 2019-06-17: 250 mg via ORAL
  Filled 2019-06-15 (×4): qty 1

## 2019-06-15 MED ORDER — AMPICILLIN 250 MG PO CAPS: 250.0000 mg | ORAL_CAPSULE | Freq: Four times a day (QID) | ORAL | Status: DC

## 2019-06-15 MED ORDER — AMOXICILLIN 500 MG PO CAPS
500.0000 mg | ORAL_CAPSULE | Freq: Three times a day (TID) | ORAL | Status: DC
  Administered 2019-06-17: 500 mg via ORAL
  Filled 2019-06-15 (×2): qty 1

## 2019-06-15 MED ORDER — ACETAMINOPHEN 325 MG PO TABS
650.0000 mg | ORAL_TABLET | ORAL | Status: DC | PRN
  Administered 2019-06-17: 650 mg via ORAL
  Filled 2019-06-15: qty 2

## 2019-06-15 MED ORDER — DOCUSATE SODIUM 100 MG PO CAPS
100.0000 mg | ORAL_CAPSULE | Freq: Every day | ORAL | Status: DC
  Filled 2019-06-15 (×2): qty 1

## 2019-06-15 MED ORDER — CALCIUM CARBONATE ANTACID 500 MG PO CHEW: 2.0000 | CHEWABLE_TABLET | ORAL | Status: DC | PRN

## 2019-06-15 MED ORDER — ERYTHROMYCIN BASE 250 MG PO TBEC: 250.0000 mg | DELAYED_RELEASE_TABLET | Freq: Four times a day (QID) | ORAL | Status: DC

## 2019-06-15 NOTE — MAU Provider Note (Signed)
History     CSN: [id number]  Arrival date and time: 06/15/19 1650   First Provider Initiated Contact with Patient 06/15/19 1726      Chief Complaint  Patient presents with  . Pelvic Pain  . Rupture of Membranes   HPI Katherine [name] is a 38 y.o. [id number] at 934w4d who presents with leaking of fluid. She states yesterday at 1700 she noticed that she started feeling more damp than normal. This afternoon, she had a big gush of clear fluid that soaked her pants. She denies any leaking or bleeding. Reports normal fetal movement. She denies any contractions. Denies HA, visual changes or epigastric pain.  OB History    Gravida  4   Para  2   Term  1   Preterm  1   AB  1   Living  3     SAB      TAB  1   Ectopic      Multiple  1   Live Births  3           Past Medical History:  Diagnosis Date  . Abnormal Pap smear 2008   Leep  . Alternating constipation and diarrhea 01/08/2013  . Depression    stopped Zolft with this pregnancy  . Gall stones   . GERD (gastroesophageal reflux disease) 01/22/2013   does not currently take meds  . History of depression   . History of gallstones 01/08/2013    Past Surgical History:  Procedure Laterality Date  . WISDOM TOOTH EXTRACTION      Family History  Problem Relation Age of Onset  . Migraines Mother   . Diverticulitis Mother   . Depression Mother   . Hyperlipidemia Mother   . Hypertension Mother   . Diabetes Mother   . Alcoholism Other        grandparent  . Lung cancer Other        grandparent  . Diabetes Other        aunt  . Stroke Other        greatgrandmother  . Diabetes Maternal Grandmother     Social History   Tobacco Use  . Smoking status: Former Smoker    Packs/day: 1.50    Years: 15.00    Pack years: 22.50    Types: Cigarettes  . Smokeless tobacco: Never Used  . Tobacco comment: quit 2012  Substance Use Topics  . Alcohol use: No    Alcohol/week: 0.0 standard drinks  . Drug use: No     Allergies:  Allergies  Allergen Reactions  . Macrobid [Nitrofurantoin] Nausea And Vomiting    Hyperemesis     Medications Prior to Admission  Medication Sig Dispense Refill Last Dose  . aspirin EC 81 MG tablet Take 1 tablet (81 mg total) by mouth daily. 30 tablet 9   . Prenatal Vit-Fe Fumarate-FA (PRENATAL VITAMIN PO) Take by mouth.       Review of Systems  Constitutional: Negative.  Negative for fatigue and fever.  HENT: Negative.   Respiratory: Negative.  Negative for shortness of breath.   Cardiovascular: Negative.  Negative for chest pain.  Gastrointestinal: Negative.  Negative for abdominal pain, constipation, diarrhea, nausea and vomiting.  Genitourinary: Positive for pelvic pain and vaginal discharge. Negative for dysuria and vaginal bleeding.  Neurological: Negative.  Negative for dizziness and headaches.   Physical Exam   Blood pressure (!) 152/72, pulse 85, temperature 97.8 F (36.6 C), temperature source Oral, resp. rate 16,  height 5\' 10"  (1.778 m), weight 135.1 kg, last menstrual period 10/23/2018, SpO2 99 %.  Patient Vitals for the past 24 hrs:  BP Temp Temp src Pulse Resp SpO2 Height Weight  06/15/19 1823 (!) 148/61 98.1 F (36.7 C) Oral 81 16 98 % - -  06/15/19 1811 (!) 154/82 - - 86 - - - -  06/15/19 1725 (!) 142/73 - - 86 - 99 % - -  06/15/19 1701 (!) 152/72 97.8 F (36.6 C) Oral 85 16 99 % - -  06/15/19 1657 - - - - - - 5\' 10"  (1.778 m) 135.1 kg   Physical Exam  Nursing note and vitals reviewed. Constitutional: She is oriented to person, place, and time. She appears well-developed and well-nourished. No distress.  HENT:  Head: Normocephalic.  Eyes: Pupils are equal, round, and reactive to light.  Cardiovascular: Normal rate, regular rhythm and normal heart sounds.  Respiratory: Effort normal and breath sounds normal. No respiratory distress.  GI: Soft. Bowel sounds are normal. She exhibits no distension. There is no abdominal tenderness.   Genitourinary:    Genitourinary Comments: SSE: pooling noted on exam, cervix visually closed   Neurological: She is alert and oriented to person, place, and time.  Skin: Skin is warm and dry.  Psychiatric: She has a normal mood and affect. Her behavior is normal. Judgment and thought content normal.   Fetal Tracing:  Baseline: 140 Variability: moderate Accels: 15x15 Decels: none  Toco: none   MAU Course  Procedures Results for orders placed or performed during the hospital encounter of 06/15/19 (from the past 24 hour(s))  Urinalysis, Routine w reflex microscopic     Status: Abnormal   Collection Time: 06/15/19  5:36 PM  Result Value Ref Range   Color, Urine YELLOW YELLOW   APPearance CLEAR CLEAR   Specific Gravity, Urine 1.017 1.005 - 1.030   pH 6.0 5.0 - 8.0   Glucose, UA NEGATIVE NEGATIVE mg/dL   Hgb urine dipstick NEGATIVE NEGATIVE   Bilirubin Urine NEGATIVE NEGATIVE   Ketones, ur NEGATIVE NEGATIVE mg/dL   Protein, ur 30 (A) NEGATIVE mg/dL   Nitrite NEGATIVE NEGATIVE   Leukocytes,Ua NEGATIVE NEGATIVE   RBC / HPF 0-5 0 - 5 RBC/hpf   WBC, UA 0-5 0 - 5 WBC/hpf   Bacteria, UA RARE (A) NONE SEEN   Squamous Epithelial / LPF 0-5 0 - 5   Mucus PRESENT    MDM UA SSE- positive pooling with fluid coming from cervical os Fern positive  Dr. Elly Modena updated, will put in admission orders.  Assessment and Plan  -PPROM  -Admit to [facility] -BMZ and latency antibiotics -Will add preeclampsia labs due to elevated BPs -Dr. Elly Modena to assume care of patient.   Wende Mott CNM 06/15/2019, 5:53 PM

## 2019-06-15 NOTE — MAU Note (Signed)
covid swab collected

## 2019-06-15 NOTE — MAU Note (Signed)
Katherine [name] is a 38 y.o. at [redacted]w[redacted]d here in MAU reporting: reports LOF since about 1000, its clear, states she is still feeling leaking. Unsure if amniotic fluid or urine. Having a lot of vaginal pressure. No bleeding. Has felt movement but states it is less then normal.   Onset of complaint: today  Pain score: 6/10  Vitals:   06/15/19 1701  BP: (!) 152/72  Pulse: 85  Resp: 16  Temp: 97.8 F (36.6 C)  SpO2: 99%     FHT:150  Lab orders placed from triage: UA

## 2019-06-16 DIAGNOSIS — O42919 Preterm premature rupture of membranes, unspecified as to length of time between rupture and onset of labor, unspecified trimester: Secondary | ICD-10-CM | POA: Diagnosis present

## 2019-06-16 MED ORDER — SODIUM CHLORIDE 0.9 % IV SOLN: 250.0000 mL | INTRAVENOUS | Status: DC | PRN

## 2019-06-16 MED ORDER — SODIUM CHLORIDE 0.9% FLUSH: 3.0000 mL | INTRAVENOUS | Status: DC | PRN

## 2019-06-16 MED ORDER — SODIUM CHLORIDE 0.9% FLUSH
3.0000 mL | Freq: Two times a day (BID) | INTRAVENOUS | Status: DC
  Administered 2019-06-16 – 2019-06-17 (×2): 3 mL via INTRAVENOUS

## 2019-06-16 NOTE — Progress Notes (Signed)
Patient ID: Katherine [name], female   DOB: Apr 22, 1981, 38 y.o.   MRN: [id number]  [name] is a 38 y.o. [id number] at  [redacted]w[redacted]d  who is admitted for rupture of membranes.    Fetal presentation is cephalic. Length of Stay:  0  Days  Date of admission:06/15/2019  Subjective: Patient is doing well without complaints. She denies HA, visual changes, RUQ/epigastric pain Patient reports the fetal movement as active. Patient reports uterine contraction  activity as none. Patient reports  vaginal bleeding as none. Patient describes fluid per vagina as Clear.  Vitals:  Blood pressure (!) 140/58, pulse 72, temperature 97.7 F (36.5 C), temperature source Oral, resp. rate 20, height 5\' 10"  (1.778 m), weight 135.1 kg, last menstrual period 10/23/2018, SpO2 99 %. Vitals:   06/15/19 1823 06/15/19 1953 06/15/19 2339 06/16/19 0444  BP: (!) 148/61 (!) 145/73 (!) 149/65 (!) 140/58  Pulse: 81 79 86 72  Resp: 16 17  20   Temp: 98.1 F (36.7 C) 97.9 F (36.6 C)  97.7 F (36.5 C)  TempSrc: Oral Oral  Oral  SpO2: 98% 99%  99%  Weight:      Height:       Physical Examination:  GENERAL: Well-developed, well-nourished female in no acute distress.  LUNGS: Clear to auscultation bilaterally.  HEART: Regular rate and rhythm. ABDOMEN: Soft, nontender, gravid PELVIC: Not indicated EXTREMITIES: No cyanosis, clubbing, or edema, 2+ distal pulses.  Fetal Monitoring:  Baseline: 130 bpm, Variability: Good {> 6 bpm), Accelerations: Reactive and Decelerations: Absent    Toco: uterine irritability  Labs:  Results for orders placed or performed during the hospital encounter of 06/15/19 (from the past 24 hour(s))  Urinalysis, Routine w reflex microscopic   Collection Time: 06/15/19  5:36 PM  Result Value Ref Range   Color, Urine YELLOW YELLOW   APPearance CLEAR CLEAR   Specific Gravity, Urine 1.017 1.005 - 1.030   pH 6.0 5.0 - 8.0   Glucose, UA NEGATIVE  NEGATIVE mg/dL   Hgb urine dipstick NEGATIVE NEGATIVE   Bilirubin Urine NEGATIVE NEGATIVE   Ketones, ur NEGATIVE NEGATIVE mg/dL   Protein, ur 30 (A) NEGATIVE mg/dL   Nitrite NEGATIVE NEGATIVE   Leukocytes,Ua NEGATIVE NEGATIVE   RBC / HPF 0-5 0 - 5 RBC/hpf   WBC, UA 0-5 0 - 5 WBC/hpf   Bacteria, UA RARE (A) NONE SEEN   Squamous Epithelial / LPF 0-5 0 - 5   Mucus PRESENT   Type and screen [place]   Collection Time: 06/15/19  5:50 PM  Result Value Ref Range   ABO/RH(D) O NEG    Antibody Screen NEG    Sample Expiration      06/18/2019,2359 Performed at [facility], [address], [place], [place] [address]   ABO/Rh   Collection Time: 06/15/19  5:50 PM  Result Value Ref Range   ABO/RH(D)      O NEG Performed at [facility] [address], [place], [place] [address]   CBC on admission   Collection Time: 06/15/19  5:53 PM  Result Value Ref Range   WBC 8.7 4.0 - 10.5 K/uL   RBC 3.37 (L) 3.87 - 5.11 MIL/uL   Hemoglobin 11.1 (L) 12.0 - 15.0 g/dL   HCT 33.2 (L) 36.0 - 46.0 %   MCV 98.5 80.0 - 100.0 fL   MCH 32.9 26.0 - 34.0 pg   MCHC 33.4 30.0 - 36.0 g/dL  RDW 15.7 (H) 11.5 - 15.5 %   Platelets 302 150 - 400 K/uL   nRBC 0.2 0.0 - 0.2 %  Comprehensive metabolic panel   Collection Time: 06/15/19  5:53 PM  Result Value Ref Range   Sodium 137 135 - 145 mmol/L   Potassium 3.6 3.5 - 5.1 mmol/L   Chloride 106 98 - 111 mmol/L   CO2 19 (L) 22 - 32 mmol/L   Glucose, Bld 99 70 - 99 mg/dL   BUN 5 (L) 6 - 20 mg/dL   Creatinine, Ser 8.110.64 0.44 - 1.00 mg/dL   Calcium 8.5 (L) 8.9 - 10.3 mg/dL   Total Protein 5.7 (L) 6.5 - 8.1 g/dL   Albumin 2.5 (L) 3.5 - 5.0 g/dL   AST 16 15 - 41 U/L   ALT 13 0 - 44 U/L   Alkaline Phosphatase 258 (H) 38 - 126 U/L   Total Bilirubin 0.3 0.3 - 1.2 mg/dL   GFR calc non Af Amer >60 >60 mL/min   GFR calc Af Amer >60 >60 mL/min   Anion gap 12 5 - 15  SARS Coronavirus 2 [facility](Hospital order, Performed [facility]) Nasopharyngeal Nasopharyngeal Swab   Collection Time: 06/15/19  5:54 PM   Specimen: Nasopharyngeal Swab  Result Value Ref Range   SARS Coronavirus 2 NEGATIVE NEGATIVE  Protein / creatinine ratio, urine   Collection Time: 06/15/19  9:10 PM  Result Value Ref Range   Creatinine, Urine 55.02 mg/dL   Total Protein, Urine 18 mg/dL   Protein Creatinine Ratio 0.33 (H) 0.00 - 0.15 mg/mg[Cre]    Imaging Studies:    Currently EPIC will not allow sonographic studies to automatically populate into notes.  In the meantime, copy and paste results into note or free text.  Medications:  Scheduled . [START ON 06/17/2019] amoxicillin  500 mg Oral Q8H  . betamethasone acetate-betamethasone sodium phosphate  12 mg Intramuscular Q24H  . docusate sodium  100 mg Oral Daily  . [START ON 06/17/2019] erythromycin  250 mg Oral Q6H  . prenatal multivitamin  1 tablet Oral Q1200   I have reviewed the patient's current medications.  ASSESSMENT: [id number] Estimated Date of Delivery: 07/30/19  Patient Active Problem List   Diagnosis Date Noted  . Preterm premature rupture of membranes 33w4 06/16/2019  . Abnormal chromosomal and genetic finding on antenatal screening mother 01/30/2019  . Supervision of other normal pregnancy, antepartum 12/20/2018  . History of pre-eclampsia in prior pregnancy, currently pregnant 12/20/2018  . History of loop electrosurgical excision procedure (LEEP) 07/11/2016  . Postpartum depression 07/11/2016  . Gestational hypertension affecting fourth pregnancy 03/11/2016  . AMA (advanced maternal age) multigravida 35+ 09/25/2015  . Mass of breast, left 08/30/2013  . Family history of DVT 08/20/2013  . Concentration deficit 08/12/2013  . GERD (gastroesophageal reflux disease) 01/22/2013  . Obesity 01/22/2013    PLAN: Patient to complete BMZ today Continue latency antibiotics Patient with GHTN- continue to monitor closely  Deliver for si/sx of chorioamnionitis  Continue  antepartum care  Katherine [name] 06/16/2019,7:42 AM

## 2019-06-17 NOTE — Progress Notes (Signed)
Patient ID: Katherine [name], female   DOB: 1981-07-14, 38 y.o.   MRN: [id number] Katherine [name] is a 38 y.o. [id number] at [redacted]w[redacted]d who is admitted for rupture of membranes.  Fetal presentation is cephalic.  Length of Stay: 1 Days Date of admission:06/15/2019  Subjective:  Patient is doing well without complaints. She denies HA, visual changes, RUQ/epigastric pain  Patient reports the fetal movement as active.  Patient reports uterine contraction activity as none.  Patient reports vaginal bleeding as none.  Patient describes fluid per vagina as Clear.  Vitals: Blood pressure (!) 140/58, pulse 72, temperature 97.7 F (36.5 C), temperature source Oral, resp. rate 20, height 5\' 10"  (1.778 m), weight 135.1 kg, last menstrual period 10/23/2018, SpO2 99 %.        Vitals:   06/15/19 1823 06/15/19 1953 06/15/19 2339 06/16/19 0444  BP: (!) 148/61 (!) 145/73 (!) 149/65 (!) 140/58  Pulse: 81 79 86 72  Resp: 16 17  20   Temp: 98.1 F (36.7 C) 97.9 F (36.6 C)  97.7 F (36.5 C)  TempSrc: Oral Oral  Oral  SpO2: 98% 99%  99%  Weight:      Height:      Physical Examination:  GENERAL: Well-developed, well-nourished female in no acute distress.  LUNGS: Clear to auscultation bilaterally.  HEART: Regular rate and rhythm.  ABDOMEN: Soft, nontender, gravid  PELVIC: Not indicated  EXTREMITIES: No cyanosis, clubbing, or edema, 2+ distal pulses.  Fetal Monitoring: Baseline: 130 bpm, Variability: Good {> 6 bpm), Accelerations: Reactive and Decelerations: Absent  Toco: uterine irritability  Labs:        Results for orders placed or performed during the hospital encounter of 06/15/19 (from the past 24 hour(s))  Urinalysis, Routine w reflex microscopic   Collection Time: 06/15/19 5:36 PM  Result Value Ref Range   Color, Urine YELLOW YELLOW   APPearance CLEAR CLEAR   Specific Gravity, Urine 1.017 1.005 - 1.030   pH 6.0 5.0 - 8.0   Glucose, UA NEGATIVE NEGATIVE mg/dL   Hgb urine dipstick NEGATIVE  NEGATIVE   Bilirubin Urine NEGATIVE NEGATIVE   Ketones, ur NEGATIVE NEGATIVE mg/dL   Protein, ur 30 (A) NEGATIVE mg/dL   Nitrite NEGATIVE NEGATIVE   Leukocytes,Ua NEGATIVE NEGATIVE   RBC / HPF 0-5 0 - 5 RBC/hpf   WBC, UA 0-5 0 - 5 WBC/hpf   Bacteria, UA RARE (A) NONE SEEN   Squamous Epithelial / LPF 0-5 0 - 5   Mucus PRESENT   Type and screen [place]   Collection Time: 06/15/19 5:50 PM  Result Value Ref Range   ABO/RH(D) O NEG    Antibody Screen NEG    Sample Expiration      06/18/2019,2359  Performed at [facility], [address] [place], [place] [address]   ABO/Rh   Collection Time: 06/15/19 5:50 PM  Result Value Ref Range   ABO/RH(D)      O NEG  Performed at [facility] [address], [place], [place] [address]   CBC on admission   Collection Time: 06/15/19 5:53 PM  Result Value Ref Range   WBC 8.7 4.0 - 10.5 K/uL   RBC 3.37 (L) 3.87 - 5.11 MIL/uL   Hemoglobin 11.1 (L) 12.0 - 15.0 g/dL   HCT 33.2 (L) 36.0 - 46.0 %   MCV 98.5 80.0 - 100.0 fL   MCH 32.9 26.0 - 34.0 pg   MCHC 33.4 30.0 - 36.0 g/dL  RDW 15.7 (H) 11.5 - 15.5 %   Platelets 302 150 - 400 K/uL   nRBC 0.2 0.0 - 0.2 %  Comprehensive metabolic panel   Collection Time: 06/15/19 5:53 PM  Result Value Ref Range   Sodium 137 135 - 145 mmol/L   Potassium 3.6 3.5 - 5.1 mmol/L   Chloride 106 98 - 111 mmol/L   CO2 19 (L) 22 - 32 mmol/L   Glucose, Bld 99 70 - 99 mg/dL   BUN 5 (L) 6 - 20 mg/dL   Creatinine, Ser 9.520.64 0.44 - 1.00 mg/dL   Calcium 8.5 (L) 8.9 - 10.3 mg/dL   Total Protein 5.7 (L) 6.5 - 8.1 g/dL   Albumin 2.5 (L) 3.5 - 5.0 g/dL   AST 16 15 - 41 U/L   ALT 13 0 - 44 U/L   Alkaline Phosphatase 258 (H) 38 - 126 U/L   Total Bilirubin 0.3 0.3 - 1.2 mg/dL   GFR calc non Af Amer >60 >60 mL/min   GFR calc Af Amer >60 >60 mL/min   Anion gap 12 5 - 15  SARS Coronavirus 2 [facility](Hospital order, Performed in [facility] hospital lab) Nasopharyngeal Nasopharyngeal Swab    Collection Time: 06/15/19 5:54 PM   Specimen: Nasopharyngeal Swab  Result Value Ref Range   SARS Coronavirus 2 NEGATIVE NEGATIVE  Protein / creatinine ratio, urine   Collection Time: 06/15/19 9:10 PM  Result Value Ref Range   Creatinine, Urine 55.02 mg/dL   Total Protein, Urine 18 mg/dL   Protein Creatinine Ratio 0.33 (H) 0.00 - 0.15 mg/mg[Cre]   Medications: Scheduled  . [START ON 06/17/2019] amoxicillin 500 mg Oral Q8H  . betamethasone acetate-betamethasone sodium phosphate 12 mg Intramuscular Q24H  . docusate sodium 100 mg Oral Daily  . [START ON 06/17/2019] erythromycin 250 mg Oral Q6H  . prenatal multivitamin 1 tablet Oral Q1200  I have reviewed the patient's current medications.  ASSESSMENT:  [id number] Estimated Date of Delivery: 07/30/19      Patient Active Problem List   Diagnosis Date Noted  . Preterm premature rupture of membranes 33w4 06/16/2019  . Abnormal chromosomal and genetic finding on antenatal screening mother 01/30/2019  . Supervision of other normal pregnancy, antepartum 12/20/2018  . History of pre-eclampsia in prior pregnancy, currently pregnant 12/20/2018  . History of loop electrosurgical excision procedure (LEEP) 07/11/2016  . Postpartum depression 07/11/2016  . Gestational hypertension affecting fourth pregnancy 03/11/2016  . AMA (advanced maternal age) multigravida 35+ 09/25/2015  . Mass of breast, left 08/30/2013  . Family history of DVT 08/20/2013  . Concentration deficit 08/12/2013  . GERD (gastroesophageal reflux disease) 01/22/2013  . Obesity 01/22/2013  PLAN:  S/p BMZ Continue latency antibiotics  Patient with GHTN- continue to monitor closely  Plan for IOL at [facility] tonight, at 34 weeks

## 2019-06-18 ENCOUNTER — Encounter (HOSPITAL_COMMUNITY): Payer: Self-pay

## 2019-06-18 ENCOUNTER — Inpatient Hospital Stay (HOSPITAL_COMMUNITY): Payer: BC Managed Care – PPO | Admitting: Anesthesiology

## 2019-06-18 ENCOUNTER — Other Ambulatory Visit: Payer: Self-pay

## 2019-06-18 ENCOUNTER — Encounter (HOSPITAL_COMMUNITY)
Admission: AD | Disposition: A | Discharge status: Home / Self Care | Payer: Self-pay | Source: Home / Self Care | Attending: Family Medicine

## 2019-06-18 DIAGNOSIS — Z6791 Unspecified blood type, Rh negative: Secondary | ICD-10-CM | POA: Diagnosis not present

## 2019-06-18 DIAGNOSIS — O328XX0 Maternal care for other malpresentation of fetus, not applicable or unspecified: Secondary | ICD-10-CM | POA: Diagnosis present

## 2019-06-18 DIAGNOSIS — Z98891 History of uterine scar from previous surgery: Secondary | ICD-10-CM

## 2019-06-18 DIAGNOSIS — O42919 Preterm premature rupture of membranes, unspecified as to length of time between rupture and onset of labor, unspecified trimester: Secondary | ICD-10-CM | POA: Diagnosis present

## 2019-06-18 DIAGNOSIS — O26893 Other specified pregnancy related conditions, third trimester: Secondary | ICD-10-CM | POA: Diagnosis present

## 2019-06-18 DIAGNOSIS — O134 Gestational [pregnancy-induced] hypertension without significant proteinuria, complicating childbirth: Secondary | ICD-10-CM | POA: Diagnosis present

## 2019-06-18 DIAGNOSIS — O26899 Other specified pregnancy related conditions, unspecified trimester: Secondary | ICD-10-CM

## 2019-06-18 DIAGNOSIS — Z20828 Contact with and (suspected) exposure to other viral communicable diseases: Secondary | ICD-10-CM | POA: Diagnosis present

## 2019-06-18 DIAGNOSIS — Z3A34 34 weeks gestation of pregnancy: Secondary | ICD-10-CM | POA: Diagnosis not present

## 2019-06-18 DIAGNOSIS — O99214 Obesity complicating childbirth: Secondary | ICD-10-CM | POA: Diagnosis present

## 2019-06-18 DIAGNOSIS — O42013 Preterm premature rupture of membranes, onset of labor within 24 hours of rupture, third trimester: Secondary | ICD-10-CM | POA: Diagnosis not present

## 2019-06-18 DIAGNOSIS — Z87891 Personal history of nicotine dependence: Secondary | ICD-10-CM | POA: Diagnosis not present

## 2019-06-18 DIAGNOSIS — Z302 Encounter for sterilization: Secondary | ICD-10-CM

## 2019-06-18 DIAGNOSIS — O1404 Mild to moderate pre-eclampsia, complicating childbirth: Secondary | ICD-10-CM | POA: Diagnosis present

## 2019-06-18 DIAGNOSIS — O42913 Preterm premature rupture of membranes, unspecified as to length of time between rupture and onset of labor, third trimester: Secondary | ICD-10-CM | POA: Diagnosis present

## 2019-06-18 LAB — CBC
HCT: 33 % — ABNORMAL LOW (ref 36.0–46.0)
Hemoglobin: 10.8 g/dL — ABNORMAL LOW (ref 12.0–15.0)
MCH: 32.8 pg (ref 26.0–34.0)
MCHC: 32.7 g/dL (ref 30.0–36.0)
MCV: 100.3 fL — ABNORMAL HIGH (ref 80.0–100.0)
Platelets: 284 10*3/uL (ref 150–400)
RBC: 3.29 MIL/uL — ABNORMAL LOW (ref 3.87–5.11)
RDW: 16.2 % — ABNORMAL HIGH (ref 11.5–15.5)
WBC: 10.6 10*3/uL — ABNORMAL HIGH (ref 4.0–10.5)
nRBC: 0.3 % — ABNORMAL HIGH (ref 0.0–0.2)

## 2019-06-18 LAB — CULTURE, BETA STREP (GROUP B ONLY)

## 2019-06-18 LAB — TYPE AND SCREEN
ABO/RH(D): O NEG
Antibody Screen: NEGATIVE

## 2019-06-18 SURGERY — Surgical Case
Anesthesia: Epidural | Wound class: Clean Contaminated

## 2019-06-18 MED ORDER — FENTANYL-BUPIVACAINE-NACL 0.5-0.125-0.9 MG/250ML-% EP SOLN
12.0000 mL/h | EPIDURAL | Status: DC | PRN
  Filled 2019-06-18: qty 250

## 2019-06-18 MED ORDER — SCOPOLAMINE 1 MG/3DAYS TD PT72
MEDICATED_PATCH | TRANSDERMAL | Status: AC
  Filled 2019-06-18: qty 1

## 2019-06-18 MED ORDER — DIPHENHYDRAMINE HCL 50 MG/ML IJ SOLN: 12.5000 mg | INTRAMUSCULAR | Status: DC | PRN

## 2019-06-18 MED ORDER — LACTATED RINGERS IV SOLN: 500.0000 mL | INTRAVENOUS | Status: DC | PRN

## 2019-06-18 MED ORDER — DEXAMETHASONE SODIUM PHOSPHATE 4 MG/ML IJ SOLN
INTRAMUSCULAR | Status: AC
  Filled 2019-06-18: qty 1

## 2019-06-18 MED ORDER — SOD CITRATE-CITRIC ACID 500-334 MG/5ML PO SOLN
30.0000 mL | ORAL | Status: DC | PRN
  Administered 2019-06-18: 30 mL via ORAL
  Filled 2019-06-18: qty 30

## 2019-06-18 MED ORDER — PHENYLEPHRINE HCL-NACL 20-0.9 MG/250ML-% IV SOLN
INTRAVENOUS | Status: AC
  Filled 2019-06-18: qty 250

## 2019-06-18 MED ORDER — MENTHOL 3 MG MT LOZG: 1.0000 | LOZENGE | OROMUCOSAL | Status: DC | PRN

## 2019-06-18 MED ORDER — SIMETHICONE 80 MG PO CHEW
80.0000 mg | CHEWABLE_TABLET | ORAL | Status: DC
  Administered 2019-06-18 – 2019-06-19 (×2): 80 mg via ORAL
  Filled 2019-06-18 (×2): qty 1

## 2019-06-18 MED ORDER — SODIUM CHLORIDE 0.9 % IV SOLN
INTRAVENOUS | Status: DC | PRN
  Administered 2019-06-18: 40 [IU] via INTRAVENOUS

## 2019-06-18 MED ORDER — COCONUT OIL OIL: 1.0000 "application " | TOPICAL_OIL | Status: DC | PRN

## 2019-06-18 MED ORDER — ONDANSETRON HCL 4 MG/2ML IJ SOLN: 4.0000 mg | Freq: Three times a day (TID) | INTRAMUSCULAR | Status: DC | PRN

## 2019-06-18 MED ORDER — GABAPENTIN 100 MG PO CAPS
100.0000 mg | ORAL_CAPSULE | Freq: Two times a day (BID) | ORAL | Status: DC
  Administered 2019-06-18 – 2019-06-20 (×4): 100 mg via ORAL
  Filled 2019-06-18 (×4): qty 1

## 2019-06-18 MED ORDER — LIDOCAINE-EPINEPHRINE (PF) 2 %-1:200000 IJ SOLN
INTRAMUSCULAR | Status: DC | PRN
  Administered 2019-06-18: 5 mL via EPIDURAL
  Administered 2019-06-18: 3 mL via EPIDURAL
  Administered 2019-06-18 (×3): 5 mL via EPIDURAL
  Administered 2019-06-18: 2 mL via EPIDURAL
  Administered 2019-06-18 (×2): 3 mL via EPIDURAL

## 2019-06-18 MED ORDER — MEASLES, MUMPS & RUBELLA VAC IJ SOLR: 0.5000 mL | Freq: Once | INTRAMUSCULAR | Status: DC

## 2019-06-18 MED ORDER — PENICILLIN G 3 MILLION UNITS IVPB - SIMPLE MED
3.0000 10*6.[IU] | INTRAVENOUS | Status: DC
  Administered 2019-06-18: 3 10*6.[IU] via INTRAVENOUS
  Filled 2019-06-18 (×2): qty 100

## 2019-06-18 MED ORDER — ONDANSETRON HCL 4 MG/2ML IJ SOLN
INTRAMUSCULAR | Status: DC | PRN
  Administered 2019-06-18: 4 mg via INTRAVENOUS

## 2019-06-18 MED ORDER — NALBUPHINE HCL 10 MG/ML IJ SOLN: 5.0000 mg | INTRAMUSCULAR | Status: DC | PRN

## 2019-06-18 MED ORDER — ACETAMINOPHEN 10 MG/ML IV SOLN
1000.0000 mg | Freq: Once | INTRAVENOUS | Status: DC | PRN
  Administered 2019-06-18: 1000 mg via INTRAVENOUS

## 2019-06-18 MED ORDER — SIMETHICONE 80 MG PO CHEW: 80.0000 mg | CHEWABLE_TABLET | ORAL | Status: DC | PRN

## 2019-06-18 MED ORDER — NALBUPHINE HCL 10 MG/ML IJ SOLN: 5.0000 mg | Freq: Once | INTRAMUSCULAR | Status: DC | PRN

## 2019-06-18 MED ORDER — SODIUM CHLORIDE 0.9 % IV SOLN
INTRAVENOUS | Status: AC
  Filled 2019-06-18: qty 500

## 2019-06-18 MED ORDER — OXYCODONE-ACETAMINOPHEN 5-325 MG PO TABS: 2.0000 | ORAL_TABLET | ORAL | Status: DC | PRN

## 2019-06-18 MED ORDER — DEXTROSE 5 % IV SOLN
INTRAVENOUS | Status: AC
  Filled 2019-06-18: qty 3000

## 2019-06-18 MED ORDER — PHENYLEPHRINE 40 MCG/ML (10ML) SYRINGE FOR IV PUSH (FOR BLOOD PRESSURE SUPPORT): 80.0000 ug | PREFILLED_SYRINGE | INTRAVENOUS | Status: DC | PRN

## 2019-06-18 MED ORDER — ACETAMINOPHEN 325 MG PO TABS
650.0000 mg | ORAL_TABLET | ORAL | Status: DC | PRN
  Administered 2019-06-18: 650 mg via ORAL
  Filled 2019-06-18: qty 2

## 2019-06-18 MED ORDER — KETOROLAC TROMETHAMINE 30 MG/ML IJ SOLN: 30.0000 mg | Freq: Four times a day (QID) | INTRAMUSCULAR | Status: AC | PRN

## 2019-06-18 MED ORDER — SODIUM CHLORIDE 0.9 % IV SOLN
5.0000 10*6.[IU] | Freq: Once | INTRAVENOUS | Status: AC
  Administered 2019-06-18: 5 10*6.[IU] via INTRAVENOUS
  Filled 2019-06-18: qty 5

## 2019-06-18 MED ORDER — KETOROLAC TROMETHAMINE 30 MG/ML IJ SOLN
30.0000 mg | Freq: Once | INTRAMUSCULAR | Status: AC | PRN
  Administered 2019-06-18: 30 mg via INTRAVENOUS

## 2019-06-18 MED ORDER — WITCH HAZEL-GLYCERIN EX PADS: 1.0000 "application " | MEDICATED_PAD | CUTANEOUS | Status: DC | PRN

## 2019-06-18 MED ORDER — DEXAMETHASONE SODIUM PHOSPHATE 4 MG/ML IJ SOLN
INTRAMUSCULAR | Status: DC | PRN
  Administered 2019-06-18: 4 mg via INTRAVENOUS

## 2019-06-18 MED ORDER — SODIUM CHLORIDE 0.9% FLUSH: 3.0000 mL | INTRAVENOUS | Status: DC | PRN

## 2019-06-18 MED ORDER — NALOXONE HCL 0.4 MG/ML IJ SOLN: 0.4000 mg | INTRAMUSCULAR | Status: DC | PRN

## 2019-06-18 MED ORDER — ONDANSETRON HCL 4 MG/2ML IJ SOLN: 4.0000 mg | Freq: Four times a day (QID) | INTRAMUSCULAR | Status: DC | PRN

## 2019-06-18 MED ORDER — LACTATED RINGERS IV SOLN
INTRAVENOUS | Status: DC
  Administered 2019-06-18 – 2019-06-19 (×2): via INTRAVENOUS

## 2019-06-18 MED ORDER — MORPHINE SULFATE (PF) 0.5 MG/ML IJ SOLN
INTRAMUSCULAR | Status: DC | PRN
  Administered 2019-06-18: 3 mg via EPIDURAL

## 2019-06-18 MED ORDER — PRENATAL MULTIVITAMIN CH
1.0000 | ORAL_TABLET | Freq: Every day | ORAL | Status: DC
  Administered 2019-06-19 – 2019-06-20 (×2): 1 via ORAL
  Filled 2019-06-18 (×2): qty 1

## 2019-06-18 MED ORDER — MORPHINE SULFATE (PF) 0.5 MG/ML IJ SOLN
INTRAMUSCULAR | Status: AC
  Filled 2019-06-18: qty 10

## 2019-06-18 MED ORDER — ACETAMINOPHEN 10 MG/ML IV SOLN
INTRAVENOUS | Status: AC
  Filled 2019-06-18: qty 100

## 2019-06-18 MED ORDER — ACETAMINOPHEN 500 MG PO TABS: 1000.0000 mg | ORAL_TABLET | Freq: Four times a day (QID) | ORAL | Status: DC

## 2019-06-18 MED ORDER — OXYCODONE HCL 5 MG PO TABS: 5.0000 mg | ORAL_TABLET | ORAL | Status: DC | PRN

## 2019-06-18 MED ORDER — DIBUCAINE (PERIANAL) 1 % EX OINT: 1.0000 "application " | TOPICAL_OINTMENT | CUTANEOUS | Status: DC | PRN

## 2019-06-18 MED ORDER — LACTATED RINGERS IV SOLN
INTRAVENOUS | Status: DC
  Administered 2019-06-18 (×2): via INTRAVENOUS

## 2019-06-18 MED ORDER — ENOXAPARIN SODIUM 80 MG/0.8ML ~~LOC~~ SOLN
65.0000 mg | SUBCUTANEOUS | Status: DC
  Administered 2019-06-19 – 2019-06-20 (×2): 65 mg via SUBCUTANEOUS
  Filled 2019-06-18 (×3): qty 0.65

## 2019-06-18 MED ORDER — IBUPROFEN 800 MG PO TABS
800.0000 mg | ORAL_TABLET | Freq: Four times a day (QID) | ORAL | Status: DC
  Administered 2019-06-19 – 2019-06-20 (×4): 800 mg via ORAL
  Filled 2019-06-18 (×4): qty 1

## 2019-06-18 MED ORDER — ACETAMINOPHEN 500 MG PO TABS
1000.0000 mg | ORAL_TABLET | Freq: Four times a day (QID) | ORAL | Status: AC
  Administered 2019-06-18 – 2019-06-19 (×3): 1000 mg via ORAL
  Filled 2019-06-18 (×4): qty 2

## 2019-06-18 MED ORDER — SENNOSIDES-DOCUSATE SODIUM 8.6-50 MG PO TABS
2.0000 | ORAL_TABLET | ORAL | Status: DC
  Administered 2019-06-18: 2 via ORAL
  Filled 2019-06-18 (×2): qty 2

## 2019-06-18 MED ORDER — TETANUS-DIPHTH-ACELL PERTUSSIS 5-2.5-18.5 LF-MCG/0.5 IM SUSP: 0.5000 mL | Freq: Once | INTRAMUSCULAR | Status: DC

## 2019-06-18 MED ORDER — SODIUM CHLORIDE (PF) 0.9 % IJ SOLN
INTRAMUSCULAR | Status: DC | PRN
  Administered 2019-06-18: 12 mL/h via EPIDURAL

## 2019-06-18 MED ORDER — DEXTROSE 5 % IV SOLN
3.0000 g | Freq: Once | INTRAVENOUS | Status: AC
  Administered 2019-06-18: 3 g via INTRAVENOUS
  Filled 2019-06-18: qty 3000

## 2019-06-18 MED ORDER — OXYTOCIN BOLUS FROM INFUSION: 500.0000 mL | Freq: Once | INTRAVENOUS | Status: DC

## 2019-06-18 MED ORDER — EPHEDRINE 5 MG/ML INJ: 10.0000 mg | INTRAVENOUS | Status: DC | PRN

## 2019-06-18 MED ORDER — SODIUM CHLORIDE 0.9 % IV SOLN
INTRAVENOUS | Status: DC | PRN
  Administered 2019-06-18: 11:00:00 via INTRAVENOUS

## 2019-06-18 MED ORDER — KETOROLAC TROMETHAMINE 30 MG/ML IJ SOLN
INTRAMUSCULAR | Status: AC
  Filled 2019-06-18: qty 1

## 2019-06-18 MED ORDER — ONDANSETRON HCL 4 MG/2ML IJ SOLN
INTRAMUSCULAR | Status: AC
  Filled 2019-06-18: qty 2

## 2019-06-18 MED ORDER — OXYCODONE-ACETAMINOPHEN 5-325 MG PO TABS: 1.0000 | ORAL_TABLET | ORAL | Status: DC | PRN

## 2019-06-18 MED ORDER — SODIUM CHLORIDE 0.9 % IV SOLN
500.0000 mg | Freq: Once | INTRAVENOUS | Status: AC
  Administered 2019-06-18: 500 mg via INTRAVENOUS

## 2019-06-18 MED ORDER — TERBUTALINE SULFATE 1 MG/ML IJ SOLN: 0.2500 mg | Freq: Once | INTRAMUSCULAR | Status: DC | PRN

## 2019-06-18 MED ORDER — LIDOCAINE-EPINEPHRINE (PF) 2 %-1:200000 IJ SOLN
INTRAMUSCULAR | Status: AC
  Filled 2019-06-18: qty 10

## 2019-06-18 MED ORDER — FENTANYL CITRATE (PF) 100 MCG/2ML IJ SOLN
INTRAMUSCULAR | Status: AC
  Filled 2019-06-18: qty 2

## 2019-06-18 MED ORDER — OXYTOCIN 40 UNITS IN NORMAL SALINE INFUSION - SIMPLE MED: 2.5000 [IU]/h | INTRAVENOUS | Status: DC

## 2019-06-18 MED ORDER — SCOPOLAMINE 1 MG/3DAYS TD PT72
1.0000 | MEDICATED_PATCH | Freq: Once | TRANSDERMAL | Status: DC
  Filled 2019-06-18: qty 1

## 2019-06-18 MED ORDER — LIDOCAINE HCL (PF) 1 % IJ SOLN: 30.0000 mL | INTRAMUSCULAR | Status: DC | PRN

## 2019-06-18 MED ORDER — KETOROLAC TROMETHAMINE 30 MG/ML IJ SOLN
30.0000 mg | Freq: Four times a day (QID) | INTRAMUSCULAR | Status: AC
  Administered 2019-06-18 – 2019-06-19 (×4): 30 mg via INTRAVENOUS
  Filled 2019-06-18 (×4): qty 1

## 2019-06-18 MED ORDER — LACTATED RINGERS IV SOLN: 500.0000 mL | Freq: Once | INTRAVENOUS | Status: DC

## 2019-06-18 MED ORDER — NALOXONE HCL 4 MG/10ML IJ SOLN
1.0000 ug/kg/h | INTRAVENOUS | Status: DC | PRN
  Filled 2019-06-18: qty 5

## 2019-06-18 MED ORDER — ZOLPIDEM TARTRATE 5 MG PO TABS: 5.0000 mg | ORAL_TABLET | Freq: Every evening | ORAL | Status: DC | PRN

## 2019-06-18 MED ORDER — SODIUM BICARBONATE 8.4 % IV SOLN: INTRAVENOUS | Status: DC | PRN

## 2019-06-18 MED ORDER — MISOPROSTOL 50MCG HALF TABLET
ORAL_TABLET | ORAL | Status: AC
  Administered 2019-06-18: 50 ug
  Filled 2019-06-18: qty 1

## 2019-06-18 MED ORDER — DIPHENHYDRAMINE HCL 25 MG PO CAPS: 25.0000 mg | ORAL_CAPSULE | Freq: Four times a day (QID) | ORAL | Status: DC | PRN

## 2019-06-18 MED ORDER — MISOPROSTOL 50MCG HALF TABLET
50.0000 ug | ORAL_TABLET | ORAL | Status: DC | PRN
  Administered 2019-06-18: 50 ug via BUCCAL
  Filled 2019-06-18: qty 1

## 2019-06-18 MED ORDER — SCOPOLAMINE 1 MG/3DAYS TD PT72
MEDICATED_PATCH | TRANSDERMAL | Status: DC | PRN
  Administered 2019-06-18: 1 via TRANSDERMAL

## 2019-06-18 MED ORDER — SIMETHICONE 80 MG PO CHEW
80.0000 mg | CHEWABLE_TABLET | Freq: Three times a day (TID) | ORAL | Status: DC
  Administered 2019-06-18 – 2019-06-20 (×5): 80 mg via ORAL
  Filled 2019-06-18 (×5): qty 1

## 2019-06-18 MED ORDER — OXYTOCIN 40 UNITS IN NORMAL SALINE INFUSION - SIMPLE MED
1.0000 m[IU]/min | INTRAVENOUS | Status: DC
  Filled 2019-06-18: qty 1000

## 2019-06-18 MED ORDER — FENTANYL CITRATE (PF) 100 MCG/2ML IJ SOLN: 25.0000 ug | INTRAMUSCULAR | Status: DC | PRN

## 2019-06-18 MED ORDER — OXYTOCIN 40 UNITS IN NORMAL SALINE INFUSION - SIMPLE MED
INTRAVENOUS | Status: AC
  Filled 2019-06-18: qty 1000

## 2019-06-18 MED ORDER — DIPHENHYDRAMINE HCL 25 MG PO CAPS: 25.0000 mg | ORAL_CAPSULE | ORAL | Status: DC | PRN

## 2019-06-18 MED ORDER — LACTATED RINGERS IV SOLN
INTRAVENOUS | Status: DC | PRN
  Administered 2019-06-18 (×2): via INTRAVENOUS

## 2019-06-18 MED ORDER — OXYTOCIN 40 UNITS IN NORMAL SALINE INFUSION - SIMPLE MED: 2.5000 [IU]/h | INTRAVENOUS | Status: AC

## 2019-06-18 SURGICAL SUPPLY — 36 items
BENZOIN TINCTURE PRP APPL 2/3 (GAUZE/BANDAGES/DRESSINGS) ×2 IMPLANT
CHLORAPREP W/TINT 26ML (MISCELLANEOUS) ×2 IMPLANT
CLAMP CORD UMBIL (MISCELLANEOUS) IMPLANT
CLIP FILSHIE TUBAL LIGA STRL (Clip) ×2 IMPLANT
CLOTH BEACON ORANGE TIMEOUT ST (SAFETY) ×2 IMPLANT
DRSG OPSITE POSTOP 4X10 (GAUZE/BANDAGES/DRESSINGS) ×2 IMPLANT
ELECT REM PT RETURN 9FT ADLT (ELECTROSURGICAL) ×2
ELECTRODE REM PT RTRN 9FT ADLT (ELECTROSURGICAL) ×1 IMPLANT
EXTRACTOR VACUUM M CUP 4 TUBE (SUCTIONS) IMPLANT
GLOVE BIOGEL PI IND STRL 7.0 (GLOVE) ×2 IMPLANT
GLOVE BIOGEL PI IND STRL 7.5 (GLOVE) ×2 IMPLANT
GLOVE BIOGEL PI INDICATOR 7.0 (GLOVE) ×2
GLOVE BIOGEL PI INDICATOR 7.5 (GLOVE) ×2
GLOVE ECLIPSE 7.5 STRL STRAW (GLOVE) ×2 IMPLANT
GOWN STRL REUS W/TWL LRG LVL3 (GOWN DISPOSABLE) ×6 IMPLANT
HOVERMATT SINGLE USE (MISCELLANEOUS) ×2 IMPLANT
KIT ABG SYR 3ML LUER SLIP (SYRINGE) IMPLANT
NEEDLE HYPO 25X5/8 SAFETYGLIDE (NEEDLE) IMPLANT
NS IRRIG 1000ML POUR BTL (IV SOLUTION) ×2 IMPLANT
PACK C SECTION WH (CUSTOM PROCEDURE TRAY) ×2 IMPLANT
PAD ABD 8X10 STRL (GAUZE/BANDAGES/DRESSINGS) ×2 IMPLANT
PAD OB MATERNITY 4.3X12.25 (PERSONAL CARE ITEMS) ×2 IMPLANT
PENCIL SMOKE EVAC W/HOLSTER (ELECTROSURGICAL) ×2 IMPLANT
RETRACTOR WND ALEXIS 25 LRG (MISCELLANEOUS) ×1 IMPLANT
RTRCTR C-SECT PINK 25CM LRG (MISCELLANEOUS) ×2 IMPLANT
RTRCTR WOUND ALEXIS 25CM LRG (MISCELLANEOUS) ×2
STRIP CLOSURE SKIN 1/2X4 (GAUZE/BANDAGES/DRESSINGS) ×2 IMPLANT
SUT PLAIN 2 0 XLH (SUTURE) ×2 IMPLANT
SUT VIC AB 0 CTX 36 (SUTURE) ×3
SUT VIC AB 0 CTX36XBRD ANBCTRL (SUTURE) ×3 IMPLANT
SUT VIC AB 2-0 CT1 27 (SUTURE) ×1
SUT VIC AB 2-0 CT1 TAPERPNT 27 (SUTURE) ×1 IMPLANT
SUT VIC AB 4-0 KS 27 (SUTURE) ×2 IMPLANT
TOWEL OR 17X24 6PK STRL BLUE (TOWEL DISPOSABLE) ×2 IMPLANT
TRAY FOLEY W/BAG SLVR 14FR LF (SET/KITS/TRAYS/PACK) ×2 IMPLANT
WATER STERILE IRR 1000ML POUR (IV SOLUTION) ×2 IMPLANT

## 2019-06-18 NOTE — H&P (Signed)
LABOR AND DELIVERY ADMISSION HISTORY AND PHYSICAL NOTE  Katherine SheenMartha L Crutchfield is a 38 y.o. female [phone] with IUP at 5371w0d by LMP and early US presenting for IOL for PPROM on 8/14. [facility] for start of induction.  She reports positive fetal movement. She denies vaginal bleeding.  Prenatal History/Complications: [facility] Pregnancy complications:  - PPROM @ 33.3 - Rh Negative  - AMA - Preeclampsia  - Hx of postpartum depression  - Hx of LEEP  - Abnormal chromosomal and genetic finding on antenatal screening mother   Past Medical History: Past Medical History:  Diagnosis Date  . Abnormal Pap smear 2008   Leep  . Alternating constipation and diarrhea 01/08/2013  . Depression    stopped Zolft with this pregnancy  . Gall stones   . GERD (gastroesophageal reflux disease) 01/22/2013   does not currently take meds  . History of depression   . History of gallstones 01/08/2013    Past Surgical History: Past Surgical History:  Procedure Laterality Date  . WISDOM TOOTH EXTRACTION      Obstetrical History: OB History    Gravida  4   Para  2   Term  1   Preterm  1   AB  1   Living  3     SAB      TAB  1   Ectopic      Multiple  1   Live Births  3           Social History: Social History   Socioeconomic History  . Marital status: Married    Spouse name: Not on file  . Number of children: Not on file  . Years of education: Not on file  . Highest education level: Not on file  Occupational History  . Occupation: Clinical biochemistloan analyst  Social Needs  . Financial resource strain: Not on file  . Food insecurity    Worry: Not on file    Inability: Not on file  . Transportation needs    Medical: Not on file    Non-medical: Not on file  Tobacco Use  . Smoking status: Former Smoker    Packs/day: 1.50    Years: 15.00    Pack years: 22.50    Types: Cigarettes  . Smokeless tobacco: Never Used  . Tobacco comment: quit 2012   Substance and Sexual Activity  . Alcohol use: No    Alcohol/week: 0.0 standard drinks  . Drug use: No  . Sexual activity: Yes    Partners: Male    Birth control/protection: None  Lifestyle  . Physical activity    Days per week: Not on file    Minutes per session: Not on file  . Stress: Not on file  Relationships  . Social Musicianconnections    Talks on phone: Not on file    Gets together: Not on file    Attends religious service: Not on file    Active member of club or organization: Not on file    Attends meetings of clubs or organizations: Not on file    Relationship status: Not on file  Other Topics Concern  . Not on file  Social History Narrative  . Not on file    Family History: Family History  Problem Relation Age of Onset  . Migraines Mother   . Diverticulitis Mother   . Depression Mother   . Hyperlipidemia Mother   . Hypertension Mother   . Diabetes Mother   .  Alcoholism Other        grandparent  . Lung cancer Other        grandparent  . Diabetes Other        aunt  . Stroke Other        greatgrandmother  . Diabetes Maternal Grandmother     Allergies: Allergies  Allergen Reactions  . Macrobid [Nitrofurantoin] Nausea And Vomiting    Hyperemesis     Medications Prior to Admission  Medication Sig Dispense Refill Last Dose  . aspirin EC 81 MG tablet Take 1 tablet (81 mg total) by mouth daily. 30 tablet 9   . Prenatal Vit-Fe Fumarate-FA (PRENATAL VITAMIN PO) Take by mouth.        Review of Systems  All systems reviewed and negative except as stated in HPI  Physical Exam Blood pressure 121/69, pulse 70, temperature 98.3 F (36.8 C), temperature source Oral, resp. rate 18, height 5\' 10"  (1.778 m), weight 135.1 kg, last menstrual period 10/23/2018, SpO2 99 %. General appearance: alert, cooperative and no distress Lungs: clear to auscultation bilaterally Heart: regular rate and rhythm Abdomen: soft, non-tender; bowel sounds normal Extremities: No calf  swelling or tenderness Presentation: cephalic Fetal monitoring: 140/ moderate/ +accels/ no decelerations  Uterine activity: UI  Dilation: 1 Effacement (%): Thick Station: -3 Exam by:: Lanice ShirtsV Ayame Rena CNM   Prenatal labs: ABO, Rh: --/--/O NEG, O NEG Performed at [facility], [address] [place], [place] [address]  [phone](08/15 1750) Antibody: NEG (08/15 1750) Rubella: 2.21 (02/20 1403) RPR: NON-REACTIVE (07/10 0909)  HBsAg: NON-REACTIVE (02/20 1403)  HIV: NON-REACTIVE (07/10 0909)  GC/Chlamydia: pending GBS:   Negative 2 hr Glucola: 85-126-87 Genetic screening:  High risk d/t fetal DNA fraction  Anatomy US: normal female    Nursing Staff Provider  Office Location  [place] Dating  LMP and Early u/s  Language  English Anatomy US   normal> f/u growth q4 weeks  Flu Vaccine   12/20/18 Genetic Screen  NIPS: elevated for triploidy           AFP:   Pt had amniocentesis-Girl   TDaP vaccine  05/10/19 Hgb A1C or  GTT Early  Third trimester  Glucose, Fasting, Gest 65 - 91 mg/dL 85   Glucose, 1 Hr, Gest 65 - 179 mg/dL 106126   Glucose, 2 Hr, Gest 65 - 152 mg/dL 87     Rhogam  Spouse is O- neg   LAB RESULTS   Feeding Plan breast Blood Type O/RH(D) NEGATIVE/-- (02/20 1403)   Contraception BTL Antibody NO ANTIBODIES DETECTED (02/20 1403)  Circumcision  n/a Rubella 2.21 (02/20 1403)  Pediatrician   RPR NON-REACTIVE (02/20 1403)   Support Person husband HBsAg NON-REACTIVE (02/20 1403)   Prenatal Classes n/a HIV NON-REACTIVE (02/20 1403)  BTL Consent Private insurance GBS     VBAC Consent n/a Pap /2/20    Hgb Electro      CF Not done this preg    SMA Not done this preg    Waterbirth  [ ]  Class [ ]  Consent [ ]  CNM visit   Prenatal Transfer Tool  Maternal Diabetes: No Genetic Screening: Abnormal:  Results: Normal amniocentesis Maternal Ultrasounds/Referrals: Normal Fetal Ultrasounds or other Referrals:  None Maternal Substance Abuse:  No Significant Maternal Medications:  None Significant  Maternal Lab Results: Group B Strep negative  No results found for this or any previous visit (from the past 24 hour(s)).  Patient Active Problem List   Diagnosis Date Noted  .  Preterm premature rupture of membranes (PPROM) with unknown onset of labor 06/18/2019  . Rh negative state in antepartum period 06/18/2019  . Preterm premature rupture of membranes 33w4 06/16/2019  . Abnormal chromosomal and genetic finding on antenatal screening mother 01/30/2019  . Supervision of other normal pregnancy, antepartum 12/20/2018  . History of pre-eclampsia in prior pregnancy, currently pregnant 12/20/2018  . History of loop electrosurgical excision procedure (LEEP) 07/11/2016  . Postpartum depression 07/11/2016  . Preeclampsia, third trimester 03/11/2016  . AMA (advanced maternal age) multigravida 35+ 09/25/2015  . Mass of breast, left 08/30/2013  . Family history of DVT 08/20/2013  . Concentration deficit 08/12/2013  . GERD (gastroesophageal reflux disease) 01/22/2013  . Obesity 01/22/2013    Assessment: Katherine [name] is a 38 y.o. [id number] at [redacted]w[redacted]d here for IOL for PPROM   #Labor: IOL with cytotec and FB, FB placed at [place] #Pain: Medications ordered PRN  #FWB: Cat I  #ID:  GBS neg, continued Penicillin d/t PPROM  #MOF: Breast  #MOC:BTL  Lajean Manes, CNM 06/18/2019, 2:01 AM

## 2019-06-18 NOTE — Anesthesia Preprocedure Evaluation (Signed)
Anesthesia Evaluation  Patient identified by MRN, date of birth, ID band Patient awake    Reviewed: Allergy & Precautions, NPO status , Patient's Chart, lab work & pertinent test results  Airway Mallampati: III  TM Distance: >3 FB Neck ROM: Full    Dental no notable dental hx.    Pulmonary neg pulmonary ROS, former smoker,    Pulmonary exam normal breath sounds clear to auscultation       Cardiovascular hypertension, Normal cardiovascular exam Rhythm:Regular Rate:Normal     Neuro/Psych PSYCHIATRIC DISORDERS Depression negative neurological ROS     GI/Hepatic Neg liver ROS, GERD  ,  Endo/Other  Morbid obesity  Renal/GU negative Renal ROS  negative genitourinary   Musculoskeletal negative musculoskeletal ROS (+)   Abdominal   Peds  Hematology negative hematology ROS (+)   Anesthesia Other Findings [redacted] weeks gestation, IOL for PPROM   Reproductive/Obstetrics (+) Pregnancy                             Anesthesia Physical Anesthesia Plan  ASA: III  Anesthesia Plan: Epidural   Post-op Pain Management:    Induction:   PONV Risk Score and Plan: Treatment may vary due to age or medical condition  Airway Management Planned: Natural Airway  Additional Equipment:   Intra-op Plan:   Post-operative Plan:   Informed Consent: I have reviewed the patients History and Physical, chart, labs and discussed the procedure including the risks, benefits and alternatives for the proposed anesthesia with the patient or authorized representative who has indicated his/her understanding and acceptance.       Plan Discussed with: Anesthesiologist  Anesthesia Plan Comments: (Patient identified. Risks, benefits, options discussed with patient including but not limited to bleeding, infection, nerve damage, paralysis, failed block, incomplete pain control, headache, blood pressure changes, nausea, vomiting,  reactions to medication, itching, and post partum back pain. Confirmed with bedside nurse the patient's most recent platelet count. Confirmed with the patient that they are not taking any anticoagulation, have any bleeding history or any family history of bleeding disorders. Patient expressed understanding and wishes to proceed. All questions were answered. )        Anesthesia Quick Evaluation

## 2019-06-18 NOTE — Anesthesia Postprocedure Evaluation (Signed)
Anesthesia Post Note  Patient: SOLVEIG FANGMAN  Procedure(s) Performed: CESAREAN SECTION (N/A )     Patient location during evaluation: Mother Baby Anesthesia Type: Epidural Level of consciousness: awake and alert Pain management: pain level controlled Vital Signs Assessment: post-procedure vital signs reviewed and stable Respiratory status: spontaneous breathing, nonlabored ventilation and respiratory function stable Cardiovascular status: stable Postop Assessment: no headache, no backache and epidural receding Anesthetic complications: no    Last Vitals:  Vitals:   06/18/19 1305 06/18/19 1358  BP: 124/70 (!) 151/85  Pulse: 60 63  Resp: 16 16  Temp: 36.5 C 36.6 C  SpO2: 98% 97%    Last Pain:  Vitals:   06/18/19 1358  TempSrc: Oral  PainSc:    Pain Goal: Patients Stated Pain Goal: 2 (06/17/19 1946)              Epidural/Spinal Function Cutaneous sensation: Normal sensation (06/18/19 1357), Patient able to flex knees: Yes (06/18/19 1357), Patient able to lift hips off bed: Yes (06/18/19 1357), Back pain beyond tenderness at insertion site: No (06/18/19 1357), Progressively worsening motor and/or sensory loss: No (06/18/19 1357), Bowel and/or bladder incontinence post epidural: No (06/18/19 1357)  Haelie Clapp

## 2019-06-18 NOTE — Progress Notes (Signed)
Transferred to labor and delivery at [place], ambulatory.  Report given to Bernita Raisin, RN at the bed side.

## 2019-06-18 NOTE — Discharge Summary (Signed)
OB Discharge Summary     Patient Name: Katherine [name] DOB: 07-11-1981 MRN: [id number]  Date of admission: 06/15/2019 Delivering MD: Truett Mainland   Date of discharge: 06/20/2019  Admitting diagnosis: pressure and fluid leakage Intrauterine pregnancy: [redacted]w[redacted]d     Secondary diagnosis:  Active Problems:   AMA (advanced maternal age) multigravida 35+   Preeclampsia, third trimester   History of pre-eclampsia in prior pregnancy, currently pregnant   Preterm premature rupture of membranes 33w4   Preterm premature rupture of membranes (PPROM) with unknown onset of labor   Rh negative state in antepartum period   Status post primary low transverse cesarean section  Additional problems: N/A     Discharge diagnosis: Preterm Pregnancy Delivered and Preeclampsia (mild)                                                                                                Post partum procedures:Rhogam not indicated, baby Rh Negative  Augmentation: Cytotec and Foley Balloon  Complications: None  Hospital course:  Induction of Labor With Cesarean Section  38 y.o. yo [phone] at [redacted]w[redacted]d was admitted to the hospital 06/15/2019 for induction of labor. Patient had a labor course significant for prolonged ROM. The patient went for cesarean section due to Malpresentation (found to be footling breech at 4cm), and delivered a Viable infant,06/18/2019  Membrane Rupture Time/Date: 5:30 PM ,06/15/2019   Details of operation can be found in separate operative Note.  Patient had an uncomplicated postpartum course. She is ambulating, tolerating a regular diet, passing flatus, and urinating well.  Patient is discharged home in stable condition on 06/20/19.                                    Physical exam  Vitals:   06/19/19 0930 06/19/19 1503 06/19/19 2138 06/20/19 0606  BP: (!) 122/55 (!) 118/57 124/64 125/66  Pulse: 62 64 70 80  Resp: 16 18 17 18   Temp: 97.6 F (36.4 C) 97.6 F (36.4 C) 98 F (36.7 C) 98  F (36.7 C)  TempSrc: Oral Oral Oral Axillary  SpO2: 98% 98% 98% 99%  Weight:      Height:       General: alert, cooperative and no distress Lochia: appropriate Uterine Fundus: firm Incision: Healing well with no significant drainage, No significant erythema, Dressing is clean, dry, and intact DVT Evaluation: No evidence of DVT seen on physical exam. No cords or calf tenderness. Equal, bilateral non-pitting edema Labs: Lab Results  Component Value Date   WBC 8.0 06/19/2019   HGB 9.0 (L) 06/19/2019   HCT 27.6 (L) 06/19/2019   MCV 101.8 (H) 06/19/2019   PLT 226 06/19/2019   CMP Latest Ref Rng & Units 06/19/2019  Glucose 70 - 99 mg/dL -  BUN 6 - 20 mg/dL -  Creatinine 0.44 - 1.00 mg/dL 0.66  Sodium 135 - 145 mmol/L -  Potassium 3.5 - 5.1 mmol/L -  Chloride 98 - 111 mmol/L -  CO2 22 - 32 mmol/L -  Calcium 8.9 - 10.3 mg/dL -  Total Protein 6.5 - 8.1 g/dL -  Total Bilirubin 0.3 - 1.2 mg/dL -  Alkaline Phos 38 - 161126 U/L -  AST 15 - 41 U/L -  ALT 0 - 44 U/L -    Discharge instruction: per After Visit Summary and "Baby and Me Booklet".  After visit meds:  Allergies as of 06/20/2019      Reactions   Macrobid [nitrofurantoin] Nausea And Vomiting   Hyperemesis       Medication List    STOP taking these medications   aspirin EC 81 MG tablet     TAKE these medications   ibuprofen 800 MG tablet Commonly known as: ADVIL Take 1 tablet (800 mg total) by mouth every 6 (six) hours.   oxyCODONE 5 MG immediate release tablet Commonly known as: Oxy IR/ROXICODONE Take 1-2 tablets (5-10 mg total) by mouth every 6 (six) hours as needed for moderate pain.   PRENATAL VITAMIN PO Take by mouth.   senna-docusate 8.6-50 MG tablet Commonly known as: Senokot-S Take 2 tablets by mouth daily. Start taking on: June 21, 2019       Diet: low salt diet  Activity: Advance as tolerated. Pelvic rest for 6 weeks.   Outpatient follow up:4 weeks Follow up Appt:No future  appointments. Follow up Visit:No follow-ups on file.   Please schedule this patient for Postpartum visit in: 4 weeks with the following provider: Any provider For C/S patients schedule nurse incision check in weeks 2 weeks: yes High risk pregnancy complicated by: PPROM @ 33.3, Rh Negative, AMA, Preeclampsia, h/o PP depression, h/o LEEP Delivery mode:  CS Anticipated Birth Control:  BTL done with CS  PP Procedures needed: Incision check, BP check  Schedule Integrated BH visit: yes   Postpartum contraception: Tubal Ligation  Newborn Data: Live born female  Birth Weight: 3 lb 12.7 oz (1720 g) APGAR: 9, 9  Newborn Delivery   Birth date/time: 06/18/2019 10:36:00 Delivery type: C-Section, Low Transverse Trial of labor: No C-section categorization: Primary      Baby Feeding: Bottle Disposition:NICU     06/20/2019 Vonzella NippleJulie , PA-C

## 2019-06-18 NOTE — Op Note (Signed)
Operative Report  PATIENT: Katherine [name]  PROCEDURE DATE: 06/18/2019  PREOPERATIVE DIAGNOSES: Intrauterine pregnancy at [redacted]w[redacted]d weeks gestation; malpresentation: footling breech with PPROM;  Undesired fertility  POSTOPERATIVE DIAGNOSES: The same  PROCEDURE: Primary Low Transverse Cesarean Section; Bilateral Tubal Sterilization using Filshie clips  SURGEON:   Surgeon(s) and Role:    * Truett Mainland, DO - Primary    * Nicolette Bang, DO - Fellow   ASSISTANT:  Phill Myron, DO - OB Fellow   INDICATIONS: Katherine [name] is a 38 y.o. [phone] at [redacted]w[redacted]d here for cesarean section secondary to the indications listed under preoperative diagnoses; please see preoperative note for further details.  The risks of cesarean section were discussed with the patient including but were not limited to: bleeding which may require transfusion or reoperation; infection which may require antibiotics; injury to bowel, bladder, ureters or other surrounding organs; injury to the fetus; need for additional procedures including hysterectomy in the event of a life-threatening hemorrhage; placental abnormalities wth subsequent pregnancies, incisional problems, thromboembolic phenomenon and other postoperative/anesthesia complications.   The patient concurred with the proposed plan, giving informed written consent for the procedure.    FINDINGS:  Viable female infant in cephalic presentation.  Apgars 9 and 9.  Infant weight: 1720g. Clear amniotic fluid.  Intact placenta, three vessel cord.  Normal uterus, fallopian tubes and ovaries bilaterally.  ANESTHESIA: Epidural INTRAVENOUS FLUIDS: 1700 mL  ESTIMATED BLOOD LOSS: 100 mL URINE OUTPUT:  447 ml SPECIMENS: Placenta sent to pathology COMPLICATIONS: None immediate  PROCEDURE IN DETAIL:  The patient preoperatively received intravenous antibiotics and had sequential compression devices applied to her lower extremities.  She was then taken to the  operating room where the epidural anesthesia was dosed up to surgical level and was found to be adequate. She was then placed in a dorsal supine position with a leftward tilt, and prepped and draped in a sterile manner.  A foley catheter was placed into her bladder and attached to constant gravity.    After an adequate timeout was performed, a Pfannenstiel skin incision was made with scalpel and carried through to the underlying layer of fascia. The fascia was incised in the midline, and this incision was extended bilaterally using the Mayo scissors.  Kocher clamps were applied to the superior aspect of the fascial incision and the underlying rectus muscles were dissected off bluntly. The rectus muscles were separated in the midline bluntly and the peritoneum was entered bluntly. Attention was turned to the lower uterine segment where a low transverse hysterotomy was made with a scalpel and extended bilaterally bluntly.  The infant was successfully delivered, the cord was clamped and cut after one minute, and the infant was handed over to the awaiting neonatology team. Uterine massage was then administered, and the placenta delivered intact with a three-vessel cord. The uterus was then cleared of clots and debris.  The hysterotomy was closed with 0 Vicryl in a running locked fashion, and an imbricating layer was also placed with 0 Vicryl.  Figure-of-eight 0 Vicryl serosal stitches were placed to help with hemostasis.  Attention was then turned to the fallopian tubes, and Filshie clips were placed about 3 cm from the cornua, with care given to incorporate the underlying mesosalpinx on both sides, allowing for bilateral tubal sterilization. The pelvis was cleared of all clot and debris. Hemostasis was confirmed on all surfaces.  The peritoneum was closed with a 0 Vicryl running stitch. The fascia was then closed using 0 Vicryl  in a running fashion.  The subcutaneous layer was irrigated, then reapproximated with 2-0  plain gut in a running fashion.  The skin was closed with a 4-0 Vicryl subcuticular stitch.   The patient tolerated the procedure well. Sponge, lap, instrument and needle counts were correct x 3.  She was taken to the recovery room in stable condition.   Maternal Disposition: PACU - hemodynamically stable.   Maternal Condition: stable   Infant Disposition: NICU   Katherine [name], D.O. OB Fellow  06/18/2019, 11:26 AM

## 2019-06-18 NOTE — Progress Notes (Signed)
Patient ID: Katherine [name], female   DOB: [birth year], 38 y.o.   MRN: [id number]  Call to bedside to confirm position. Baby footling breech. PPROM.  Discussed options with mom - at this point, cesarean delivery best option as low success rate for version and breech vaginal delivery.  The risks of cesarean section discussed with the patient included but were not limited to: bleeding which may require transfusion or reoperation; infection which may require antibiotics; injury to bowel, bladder, ureters or other surrounding organs; injury to the fetus; need for additional procedures including hysterectomy in the event of a life-threatening hemorrhage; placental abnormalities wth subsequent pregnancies, incisional problems, thromboembolic phenomenon and other postoperative/anesthesia complications. The patient concurred with the proposed plan, giving informed written consent for the procedure.     Patient wants BTL - 1% failure rate discussed with increased risk of ectopic pregnancy.  Patient has been NPO since last night, she will remain NPO for procedure. Anesthesia and OR aware.  Preoperative prophylactic Ancef and azithromycin ordered on call to the OR.  To OR when ready.   Truett Mainland, DO 06/18/2019 9:57 AM

## 2019-06-18 NOTE — Progress Notes (Signed)
Epidural removed tip intact

## 2019-06-18 NOTE — Progress Notes (Signed)
LABOR PROGRESS NOTE  Katherine [name] is a 38 y.o. [phone] at [redacted]w[redacted]d  admitted for IOL for PPROM  Subjective: Patient doing well, reports occasional cramping   Objective: BP (!) 155/82   Pulse 71   Temp 97.9 F (36.6 C) (Oral)   Resp 18   Ht 5\' 10"  (1.778 m)   Wt 135.1 kg   LMP 10/23/2018   SpO2 99%   BMI 42.74 kg/m  or  Vitals:   06/18/19 0325 06/18/19 0420 06/18/19 0520 06/18/19 0645  BP: 133/72 128/71 140/72 (!) 155/82  Pulse: 76 72 67 71  Resp: 18 16 18 18   Temp: 98.2 F (36.8 C)  97.9 F (36.6 C)   TempSrc: Oral  Oral   SpO2:      Weight:      Height:        FB out, last cytotec given around 0400- pitocin to be initiated  Dilation: 4 Effacement (%): 30, 40 Cervical Position: Posterior Station: -3 Presentation: Vertex Exam by:: Cassie Freer, RN FHT: baseline rate 135, moderate varibility, +accel, no decel Toco: 2-7, mild by palpation   Labs: Lab Results  Component Value Date   WBC 8.7 06/15/2019   HGB 11.1 (L) 06/15/2019   HCT 33.2 (L) 06/15/2019   MCV 98.5 06/15/2019   PLT 302 06/15/2019    Patient Active Problem List   Diagnosis Date Noted  . Preterm premature rupture of membranes (PPROM) with unknown onset of labor 06/18/2019  . Rh negative state in antepartum period 06/18/2019  . Preterm premature rupture of membranes 33w4 06/16/2019  . Abnormal chromosomal and genetic finding on antenatal screening mother 01/30/2019  . Supervision of other normal pregnancy, antepartum 12/20/2018  . History of pre-eclampsia in prior pregnancy, currently pregnant 12/20/2018  . History of loop electrosurgical excision procedure (LEEP) 07/11/2016  . Postpartum depression 07/11/2016  . Preeclampsia, third trimester 03/11/2016  . AMA (advanced maternal age) multigravida 35+ 09/25/2015  . Mass of breast, left 08/30/2013  . Family history of DVT 08/20/2013  . Concentration deficit 08/12/2013  . GERD (gastroesophageal reflux disease) 01/22/2013  . Obesity  01/22/2013    Assessment / Plan: 38 y.o. [id number] at [redacted]w[redacted]d here for IOL for PPROM   Labor: FB out, last cytotec given around 0400- pitocin to be initiated around 0800 Fetal Wellbeing:  Cat I Pain Control:  Planning epidural  Anticipated MOD:  SVD  Katherine [name], CNM 06/18/2019, 7:05 AM

## 2019-06-18 NOTE — Transfer of Care (Signed)
Immediate Anesthesia Transfer of Care Note  Patient: Katherine [name]  Procedure(s) Performed: CESAREAN SECTION (N/A )  Patient Location: PACU  Anesthesia Type:Epidural  Level of Consciousness: awake  Airway & Oxygen Therapy: Patient Spontanous Breathing  Post-op Assessment: Report given to RN and Post -op Vital signs reviewed and stable  Post vital signs: stable  Last Vitals:  Vitals Value Taken Time  BP 108/62 06/18/19 1130  Temp    Pulse 69 06/18/19 1131  Resp 17 06/18/19 1131  SpO2 100 % 06/18/19 1131  Vitals shown include unvalidated device data.  Last Pain:  Vitals:   06/18/19 0915  TempSrc: Oral  PainSc:       Patients Stated Pain Goal: 2 (11/94/17 4081)  Complications: No apparent anesthesia complications

## 2019-06-19 ENCOUNTER — Encounter (HOSPITAL_COMMUNITY): Payer: Self-pay | Admitting: Family Medicine

## 2019-06-19 LAB — CBC
HCT: 27.6 % — ABNORMAL LOW (ref 36.0–46.0)
Hemoglobin: 9 g/dL — ABNORMAL LOW (ref 12.0–15.0)
MCH: 33.2 pg (ref 26.0–34.0)
MCHC: 32.6 g/dL (ref 30.0–36.0)
MCV: 101.8 fL — ABNORMAL HIGH (ref 80.0–100.0)
Platelets: 226 10*3/uL (ref 150–400)
RBC: 2.71 MIL/uL — ABNORMAL LOW (ref 3.87–5.11)
RDW: 16.2 % — ABNORMAL HIGH (ref 11.5–15.5)
WBC: 8 10*3/uL (ref 4.0–10.5)
nRBC: 0.4 % — ABNORMAL HIGH (ref 0.0–0.2)

## 2019-06-19 LAB — CREATININE, SERUM
Creatinine, Ser: 0.66 mg/dL (ref 0.44–1.00)
GFR calc Af Amer: >60 mL/min (ref >60)
GFR calc non Af Amer: >60 mL/min (ref >60)

## 2019-06-19 LAB — RPR: RPR Ser Ql: NONREACTIVE

## 2019-06-19 NOTE — Clinical Social Work Maternal (Signed)
CLINICAL SOCIAL WORK MATERNAL/CHILD NOTE  Patient Details  Name: Katherine [name] MRN: [id number] Date of Birth: Jan 07, 1981  Date:  06/19/2019  Clinical Social Worker Initiating Note:  Blaine HamperAngel Boyd-Gilyard Date/Time: Initiated:  06/19/19/1324     Child's Name:  Katherine [name]   Biological Parents:  Mother, Father   Need for Interpreter:  None   Reason for Referral:  Behavioral Health Concerns(hx of PPD/depression)   Address:  [address] [place] [address]    Phone number:  [phone] (home) [phone] (work)    Additional phone number:  Household Members/Support Persons (HM/SP):   Household Member/Support Person 1, Household Member/Support Person 3, Household Member/Support Person 2, Household Member/Support Person 4   HM/SP Name Relationship DOB or Age  HM/SP -1 [place]had Dutton FOB/Husband 02/20/1989  HM/SP -2 Ethan son 03/15/16  HM/SP -3 Deatra Cantermerson daughter 03/15/16  HM/SP -4 Macy daughter 40 years of age  HM/SP -5        HM/SP -6        HM/SP -7        HM/SP -8          Natural Supports (not living in the home):  Extended Family, Parent, Immediate Family   Professional Supports: Therapist   Employment: Full-time   Type of Work: Tax inspectorbank analysis   Education:  Engineer, maintenance (IT)College graduate   Homebound arranged:    Surveyor, quantityinancial Resources:  Media plannerrivate Insurance   Other Resources:      Cultural/Religious Considerations Which May Impact Care:  Per Apple ComputerMOB's Face Sheet, MOB is CuratorChristian.  Strengths:  Ability to meet basic needs , Pediatrician chosen, Home prepared for child , Understanding of illness, Psychotropic Medications   Psychotropic Medications:  Zoloft      [place]  [place]    [place]    [place]    [place]    [place]    [place]      Pediatrician Fax Number:    Risk Factors/Current Problems:  Mental Health Concerns    Cognitive State:  Able to Concentrate ,  Alert , Linear Thinking , Insightful , Goal Oriented    Mood/Affect:  Happy , Bright , Interested , Relaxed    CSW Assessment: CSW meet with MOB to complete an assessment for mental health concerns. When CSW arrived, MOB was resting in bed and was receptive to meeting with CSW. MOB was inviting and polite. CSW inquired about MOB's MH hx and MOB acknowledged being depressed throughout life however, didn't seek help until she experienced symptoms after the birth of the her twins. MOB was prescribed Zoloft and reported that the medicaiton has mananaged her symptoms. CSW educated MOB about PPD. CSW informed MOB of possible supports and interventions to decrease PPD.  CSW also encouraged MOB to seek medical attention if needed for increased signs and symptoms of PPD.  CSW offered MOB resources for outpatient behavioral health services and; MOB declined. CSW reviewed safe sleep and SIDS. MOB and was knowledgeable and asked appropriate questions. CSW thanked MOB for meeting with CSW.  MOB communicated that MOB has everything she needs for the baby and is prepared to meet her infant's needs.  MOB did not have any further questions, concerns, or needs at this time, and CSW thanked MOB for allowing CSW to meet with her.  CSW will continue to offer family resources and supports while infant remains in NICU.   CSW Plan/Description:  Psychosocial Support and Ongoing Assessment  of Needs, Sudden Infant Death Syndrome (SIDS) Education, Perinatal Mood and Anxiety Disorder (PMADs) Education, Other Patient/Family Education, Other Information/Referral to [employer], MSW, [employer] Social Work [phone]   Dimple Nanas, LCSW 06/19/2019, 1:41 PM

## 2019-06-19 NOTE — Progress Notes (Signed)
Subjective: Postpartum Day 1: Cesarean Delivery and Bilateral Tubal Ligation Patient reports no acute events overnight. Pt is tolerating PO, + flatus and no problems voiding. Pt has been ambulating well without any issues. Pain is well controlled.   Objective: Vital signs in last 24 hours: Temp:  [97 F (36.1 C)-97.9 F (36.6 C)] 97.6 F (36.4 C) (08/19 0628) Pulse Rate:  [50-112] 57 (08/19 0628) Resp:  [9-28] 18 (08/19 0628) BP: (104-153)/(42-119) 111/64 (08/19 0628) SpO2:  [95 %-100 %] 97 % (08/18 2245)  Physical Exam:  General: alert, cooperative and no distress Lochia: appropriate Uterine Fundus: firm Incision: healing well, no significant drainage, no significant erythema DVT Evaluation: No evidence of DVT seen on physical exam. Negative Homan's sign. No cords or calf tenderness. No significant calf/ankle edema.  Recent Labs    06/18/19 0801  HGB 10.8*  HCT 33.0*    Assessment/Plan: Status post Cesarean section. Doing well postoperatively.  Continue current care.  Katherine [name] 06/19/2019, 7:57 AM

## 2019-06-19 NOTE — Plan of Care (Signed)
  Problem: Education: Goal: Knowledge of condition will improve Outcome: Progressing   Problem: Activity: Goal: Ability to tolerate increased activity will improve Outcome: Progressing Note: Pt has ambulated in room without difficulty &  tolerates well.   Problem: Coping: Goal: Ability to identify and utilize available resources and services will improve Outcome: Progressing   Problem: Life Cycle: Goal: Chance of risk for complications during the postpartum period will decrease Outcome: Progressing   Problem: Role Relationship: Goal: Ability to demonstrate positive interaction with newborn will improve Outcome: Progressing   Problem: Skin Integrity: Goal: Demonstration of wound healing without infection will improve Outcome: Progressing

## 2019-06-19 NOTE — Lactation Note (Signed)
This note was copied from a baby's chart. Lactation Consultation Note  Patient Name: Girl Nihal Doan [id number] Date: 06/19/2019 Reason for consult: Initial assessment;NICU baby;Late-preterm 92-36.6wks  I visited Ms. Warth. I conducted basic breast feeding education. I assisted Ms. Champney in latch attempt with baby "Eduard Clos" on her right breast in football hold. Baby opened mouth around the nipple and gave a practice suck, but she did not latch. I recommended mom express milk into baby's mouth to encourage latch, which she did. Baby still did not grasp the breast. Her nipples are slightly larger, but they are everted, and her breast tissue is compressible. Baby appears to be able to fully open her mouth around mom's nipple.   I discussed the benefits of colostrum and encouraged her to spoon feed any expressed milk (via pump or hand expression) to baby Charlie.  Mom states that baby is taking a bottle well. RN came into room to give bottle using a extra slow-flow (purple) nipple. RN indicates that there is a plan to transition to a preemie nipple. Baby takes bottle quickly, per mom.   Mom states that she is relaxed regarding the feeding plan. She wants baby to be a "hybrid" feeding at home because she will be working from home while managing baby and 50 year old twins. She wants to be able to provide a bottle.  We reviewed pumping best practices and the NICU booklet. I also shared our community breast feeding resources and recommended that Ms. Aaronson consider an OP appointment to continue to work on latch after discharge.  PLAN: Offer breast first with each feeding following with bottle feeding and pumping. Pump at least 8 times a day for 15 minutes using the initiation setting. Mom has been using size 27 flanges. I recommended that she may need to size up to 30 flanges and explained rationale. I suggested that an [place] view her pumping prior to D/C. She verbalized  understanding.  Mom appeared upbeat and was ambulating well. She was eager for lactation assistance.    Maternal Data Formula Feeding for Exclusion: No Has patient been taught Hand Expression?: Yes Does the patient have breastfeeding experience prior to this delivery?: Yes  Feeding Feeding Type: Breast Milk with Formula added Nipple Type: Nfant Slow Flow (purple)  LATCH Score Latch: Too sleepy or reluctant, no latch achieved, no sucking elicited.  Audible Swallowing: None  Type of Nipple: Everted at rest and after stimulation  Comfort (Breast/Nipple): Soft / non-tender  Hold (Positioning): Assistance needed to correctly position infant at breast and maintain latch.  LATCH Score: 5  Interventions Interventions: Breast feeding basics reviewed;Assisted with latch;Hand express;Breast compression;Support pillows  Lactation Tools Discussed/Used Pump Review: Setup, frequency, and cleaning   Consult Status Consult Status: Follow-up Date: 06/20/19 Follow-up type: In-patient    Lenore Manner 06/19/2019, 4:37 PM

## 2019-06-19 NOTE — Progress Notes (Signed)
Subjective: Postpartum Day 1: Cesarean Delivery Patient reports incisional pain, tolerating PO, + flatus and no problems voiding.    Objective: Vital signs in last 24 hours: Temp:  [97 F (36.1 C)-97.9 F (36.6 C)] 97.6 F (36.4 C) (08/19 0628) Pulse Rate:  [50-112] 57 (08/19 0628) Resp:  [9-28] 18 (08/19 0628) BP: (104-153)/(42-119) 111/64 (08/19 0628) SpO2:  [95 %-100 %] 97 % (08/18 2245)  Physical Exam:  General: alert, cooperative and no distress Lochia: appropriate Uterine Fundus: firm Incision: no significant drainage DVT Evaluation: No evidence of DVT seen on physical exam.  Recent Labs    06/18/19 0801  HGB 10.8*  HCT 33.0*    Assessment/Plan: Status post Cesarean section. Doing well postoperatively.  Continue current care.  Katherine [name] 06/19/2019, 7:25 AM

## 2019-06-20 MED ORDER — OXYCODONE HCL 5 MG PO TABS: 5.0000 mg | ORAL_TABLET | Freq: Four times a day (QID) | ORAL | 0 refills | Status: DC | PRN

## 2019-06-20 MED ORDER — IBUPROFEN 800 MG PO TABS: 800.0000 mg | ORAL_TABLET | Freq: Four times a day (QID) | ORAL | 0 refills | Status: DC

## 2019-06-20 MED ORDER — SENNOSIDES-DOCUSATE SODIUM 8.6-50 MG PO TABS: 2.0000 | ORAL_TABLET | ORAL | 0 refills | Status: DC

## 2019-06-20 NOTE — Discharge Instructions (Signed)

## 2019-06-21 ENCOUNTER — Ambulatory Visit (HOSPITAL_COMMUNITY): Payer: BC Managed Care – PPO

## 2019-06-21 ENCOUNTER — Inpatient Hospital Stay (HOSPITAL_COMMUNITY)
Admission: AD | Admit: 2019-06-21 | Discharge: 2019-06-22 | Disposition: A | Discharge status: Home / Self Care | Payer: BC Managed Care – PPO | Attending: Obstetrics & Gynecology | Admitting: Obstetrics & Gynecology

## 2019-06-21 ENCOUNTER — Other Ambulatory Visit: Payer: Self-pay

## 2019-06-21 ENCOUNTER — Encounter (HOSPITAL_COMMUNITY): Payer: Self-pay

## 2019-06-21 ENCOUNTER — Ambulatory Visit: Payer: Self-pay

## 2019-06-21 ENCOUNTER — Encounter (HOSPITAL_COMMUNITY): Payer: Self-pay | Admitting: *Deleted

## 2019-06-21 DIAGNOSIS — Z87891 Personal history of nicotine dependence: Secondary | ICD-10-CM | POA: Insufficient documentation

## 2019-06-21 DIAGNOSIS — T148XXA Other injury of unspecified body region, initial encounter: Secondary | ICD-10-CM

## 2019-06-21 DIAGNOSIS — Z9889 Other specified postprocedural states: Secondary | ICD-10-CM | POA: Insufficient documentation

## 2019-06-21 DIAGNOSIS — Z8249 Family history of ischemic heart disease and other diseases of the circulatory system: Secondary | ICD-10-CM | POA: Insufficient documentation

## 2019-06-21 DIAGNOSIS — Z833 Family history of diabetes mellitus: Secondary | ICD-10-CM | POA: Insufficient documentation

## 2019-06-21 DIAGNOSIS — R200 Anesthesia of skin: Secondary | ICD-10-CM | POA: Insufficient documentation

## 2019-06-21 DIAGNOSIS — Z98891 History of uterine scar from previous surgery: Secondary | ICD-10-CM

## 2019-06-21 DIAGNOSIS — Z883 Allergy status to other anti-infective agents status: Secondary | ICD-10-CM | POA: Insufficient documentation

## 2019-06-21 DIAGNOSIS — X58XXXA Exposure to other specified factors, initial encounter: Secondary | ICD-10-CM | POA: Insufficient documentation

## 2019-06-21 NOTE — MAU Note (Signed)
Having some numbness above incision and up toward umbilicus. Also concerned with an area of redness that looks like a bruise. Baby is in NICU and pt is staying with baby in [place] care.Had c/s 8/18 due to footling breech. PROM 06/15/19

## 2019-06-21 NOTE — Lactation Note (Addendum)
This note was copied from a baby's chart. Lactation Consultation Note  Patient Name: Katherine [name] [id number] Date: 06/21/2019 Reason for consult: NICU baby  Mom had no questions or concerns about pumping, except for requesting size 30 flanges, which were provided to her.  Mom reports that over the last 2 pumping sessions she was able to express a total of 38 mL.  Matthias Hughs [facility] 06/21/2019, 10:04 AM

## 2019-06-21 NOTE — Discharge Instructions (Signed)
Postpartum Care After Cesarean Delivery °This sheet gives you information about how to care for yourself from the time you deliver your baby to up to 6-12 weeks after delivery (postpartum period). Your health care provider may also give you more specific instructions. If you have problems or questions, contact your health care provider. °Follow these instructions at home: °Medicines °· Take over-the-counter and prescription medicines only as told by your health care provider. °· If you were prescribed an antibiotic medicine, take it as told by your health care provider. Do not stop taking the antibiotic even if you start to feel better. °· Ask your health care provider if the medicine prescribed to you: °? Requires you to avoid driving or using heavy machinery. °? Can cause constipation. You may need to take actions to prevent or treat constipation, such as: °§ Drink enough fluid to keep your urine pale yellow. °§ Take over-the-counter or prescription medicines. °§ Eat foods that are high in fiber, such as beans, whole grains, and fresh fruits and vegetables. °§ Limit foods that are high in fat and processed sugars, such as fried or sweet foods. °Activity °· Gradually return to your normal activities as told by your health care provider. °· Avoid activities that take a lot of effort and energy (are strenuous) until approved by your health care provider. Walking at a slow to moderate pace is usually safe. Ask your health care provider what activities are safe for you. °? Do not lift anything that is heavier than your baby or 10 lb (4.5 kg) as told by your health care provider. °? Do not vacuum, climb stairs, or drive a car for as long as told by your health care provider. °· If possible, have someone help you at home until you are able to do your usual activities yourself. °· Rest as much as possible. Try to rest or take naps while your baby is sleeping. °Vaginal bleeding °· It is normal to have vaginal bleeding  (lochia) after delivery. Wear a sanitary pad to absorb vaginal bleeding and discharge. °? During the first week after delivery, the amount and appearance of lochia is often similar to a menstrual period. °? Over the next few weeks, it will gradually decrease to a dry, yellow-brown discharge. °? For most women, lochia stops completely by 4-6 weeks after delivery. Vaginal bleeding can vary from woman to woman. °· Change your sanitary pads frequently. Watch for any changes in your flow, such as: °? A sudden increase in volume. °? A change in color. °? Large blood clots. °· If you pass a blood clot, save it and call your health care provider to discuss. Do not flush blood clots down the toilet before you get instructions from your health care provider. °· Do not use tampons or douches until your health care provider says this is safe. °· If you are not breastfeeding, your period should return 6-8 weeks after delivery. If you are breastfeeding, your period may return anytime between 8 weeks after delivery and the time that you stop breastfeeding. °Perineal care ° °· If your C-section (Cesarean section) was unplanned, and you were allowed to labor and push before delivery, you may have pain, swelling, and discomfort of the tissue between your vaginal opening and your anus (perineum). You may also have an incision in the tissue (episiotomy) or the tissue may have torn during delivery. Follow these instructions as told by your health care provider: °? Keep your perineum clean and dry as told by   your health care provider. Use medicated pads and pain-relieving sprays and creams as directed. °? If you have an episiotomy or vaginal tear, check the area every day for signs of infection. Check for: °§ Redness, swelling, or pain. °§ Fluid or blood. °§ Warmth. °§ Pus or a bad smell. °? You may be given a squirt bottle to use instead of wiping to clean the perineum area after you go to the bathroom. As you start healing, you may use  the squirt bottle before wiping yourself. Make sure to wipe gently. °? To relieve pain caused by an episiotomy, vaginal tear, or hemorrhoids, try taking a warm sitz bath 2-3 times a day. A sitz bath is a warm water bath that is taken while you are sitting down. The water should only come up to your hips and should cover your buttocks. °Breast care °· Within the first few days after delivery, your breasts may feel heavy, full, and uncomfortable (breast engorgement). You may also have milk leaking from your breasts. Your health care provider can suggest ways to help relieve breast discomfort. Breast engorgement should go away within a few days. °· If you are breastfeeding: °? Wear a bra that supports your breasts and fits you well. °? Keep your nipples clean and dry. Apply creams and ointments as told by your health care provider. °? You may need to use breast pads to absorb milk leakage. °? You may have uterine contractions every time you breastfeed for several weeks after delivery. Uterine contractions help your uterus return to its normal size. °? If you have any problems with breastfeeding, work with your health care provider or a lactation consultant. °· If you are not breastfeeding: °? Avoid touching your breasts as this can make your breasts produce more milk. °? Wear a well-fitting bra and use cold packs to help with swelling. °? Do not squeeze out (express) milk. This causes you to make more milk. °Intimacy and sexuality °· Ask your health care provider when you can engage in sexual activity. This may depend on your: °? Risk of infection. °? Healing rate. °? Comfort and desire to engage in sexual activity. °· You are able to get pregnant after delivery, even if you have not had your period. If desired, talk with your health care provider about methods of family planning or birth control (contraception). °Lifestyle °· Do not use any products that contain nicotine or tobacco, such as cigarettes, e-cigarettes,  and chewing tobacco. If you need help quitting, ask your health care provider. °· Do not drink alcohol, especially if you are breastfeeding. °Eating and drinking ° °· Drink enough fluid to keep your urine pale yellow. °· Eat high-fiber foods every day. These may help prevent or relieve constipation. High-fiber foods include: °? Whole grain cereals and breads. °? Brown rice. °? Beans. °? Fresh fruits and vegetables. °· Take your prenatal vitamins until your postpartum checkup or until your health care provider tells you it is okay to stop. °General instructions °· Keep all follow-up visits for you and your baby as told by your health care provider. Most women visit their health care provider for a postpartum checkup within the first 3-6 weeks after delivery. °Contact a health care provider if you: °· Feel unable to cope with the changes that a new baby brings to your life, and these feelings do not go away. °· Feel unusually sad or worried. °· Have breasts that are painful, hard, or turn red. °· Have a fever. °·   Have trouble holding urine or keeping urine from leaking. °· Have little or no interest in activities you used to enjoy. °· Have not breastfed at all and you have not had a menstrual period for 12 weeks after delivery. °· Have stopped breastfeeding and you have not had a menstrual period for 12 weeks after you stopped breastfeeding. °· Have questions about caring for yourself or your baby. °· Pass a blood clot from your vagina. °Get help right away if you: °· Have chest pain. °· Have difficulty breathing. °· Have sudden, severe leg pain. °· Have severe pain or cramping in your abdomen. °· Bleed from your vagina so much that you fill more than one sanitary pad in one hour. Bleeding should not be heavier than your heaviest period. °· Develop a severe headache. °· Faint. °· Have blurred vision or spots in your vision. °· Have a bad-smelling vaginal discharge. °· Have thoughts about hurting yourself or your  baby. °If you ever feel like you may hurt yourself or others, or have thoughts about taking your own life, get help right away. You can go to your nearest emergency department or call: °· Your local emergency services (911 in the [place] °· A suicide crisis helpline, such as the National Suicide Prevention Lifeline at 1-800-273-8255. This is open 24 hours a day. °Summary °· The period of time from when you deliver your baby to up to 6-12 weeks after delivery is called the postpartum period. °· Gradually return to your normal activities as told by your health care provider. °· Keep all follow-up visits for you and your baby as told by your health care provider. °This information is not intended to replace advice given to you by your health care provider. Make sure you discuss any questions you have with your health care provider. °Document Released: 10/14/2000 Document Revised: 06/06/2018 Document Reviewed: 06/06/2018 °Elsevier Patient Education © 2020 [employer] ° °

## 2019-06-21 NOTE — MAU Provider Note (Signed)
History     CSN: [id number]  Arrival date and time: 06/21/19 2244   First Provider Initiated Contact with Patient 06/21/19 2339      Chief Complaint  Patient presents with  . Wound Check   38 y.o. female 3 days post CS at 70 weeks presenting with concerns of bruising and numbness. Reports seeing a new bruise just below her umbilicus today. Once she noticed it she also felt the area was numb which is new. No pain in the area. Denies any injury. Denies redness, drainage, or edema of incision. No fevers. Incisional pain and lochia are minimal. She is visiting the baby in the NICU.   OB History    Gravida  4   Para  3   Term  1   Preterm  2   AB  1   Living  4     SAB      TAB  1   Ectopic      Multiple  1   Live Births  4           Past Medical History:  Diagnosis Date  . Abnormal Pap smear 2008   Leep  . Alternating constipation and diarrhea 01/08/2013  . Depression    stopped Zolft with this pregnancy  . Gall stones   . GERD (gastroesophageal reflux disease) 01/22/2013   does not currently take meds  . History of depression   . History of gallstones 01/08/2013    Past Surgical History:  Procedure Laterality Date  . CESAREAN SECTION N/A 06/18/2019   Procedure: CESAREAN SECTION;  Surgeon: Truett Mainland, DO;  Location: [facility];  Service: Obstetrics;  Laterality: N/A;  . WISDOM TOOTH EXTRACTION      Family History  Problem Relation Age of Onset  . Migraines Mother   . Diverticulitis Mother   . Depression Mother   . Hyperlipidemia Mother   . Hypertension Mother   . Diabetes Mother   . Alcoholism Other        grandparent  . Lung cancer Other        grandparent  . Diabetes Other        aunt  . Stroke Other        greatgrandmother  . Diabetes Maternal Grandmother     Social History   Tobacco Use  . Smoking status: Former Smoker    Packs/day: 1.50    Years: 15.00    Pack years: 22.50    Types: Cigarettes  . Smokeless tobacco: Never  Used  . Tobacco comment: quit 2012  Substance Use Topics  . Alcohol use: No    Alcohol/week: 0.0 standard drinks  . Drug use: No    Allergies:  Allergies  Allergen Reactions  . Macrobid [Nitrofurantoin] Nausea And Vomiting    Hyperemesis     No medications prior to admission.    Review of Systems  Constitutional: Negative for fever.  Gastrointestinal: Negative for abdominal pain.  Genitourinary: Positive for vaginal bleeding (lochia).  Skin: Positive for color change.   Physical Exam   Blood pressure 136/68, pulse 85, temperature 98.2 F (36.8 C), resp. rate 18, height 5\' 10"  (1.778 m), weight 132.5 kg, unknown if currently breastfeeding.  Physical Exam  Nursing note and vitals reviewed. Constitutional: She is oriented to person, place, and time. She appears well-developed and well-nourished. No distress.  HENT:  Head: Normocephalic and atraumatic.  Neck: Normal range of motion.  Cardiovascular: Normal rate.  Respiratory: Effort normal. No  respiratory distress.  GI: Soft. She exhibits no distension and no mass. There is no abdominal tenderness. There is no rebound and no guarding.  Musculoskeletal: Normal range of motion.  Neurological: She is alert and oriented to person, place, and time.  Skin: Skin is warm and dry.      No results found for this or any previous visit (from the past 24 hour(s)).  MAU Course  Procedures  MDM No evidence of hematoma. Incision healing well, no signs of infection or dehiscence. Pt reassured. Stable for discharge home.   Assessment and Plan   1. Bruise   2. Numbness   3. Status post cesarean section    [facility] Return precautions  Allergies as of 06/22/2019      Reactions   Macrobid [nitrofurantoin] Nausea And Vomiting   Hyperemesis       Medication List    TAKE these medications   ibuprofen 800 MG tablet Commonly known as: ADVIL Take 1 tablet (800 mg total) by mouth every 6  (six) hours.   oxyCODONE 5 MG immediate release tablet Commonly known as: Oxy IR/ROXICODONE Take 1-2 tablets (5-10 mg total) by mouth every 6 (six) hours as needed for moderate pain.   PRENATAL VITAMIN PO Take by mouth.   senna-docusate 8.6-50 MG tablet Commonly known as: Senokot-S Take 2 tablets by mouth daily.      Donette LarryMelanie Olympia Adelsberger, CNM 06/22/2019, 1:09 AM

## 2019-06-22 DIAGNOSIS — Z883 Allergy status to other anti-infective agents status: Secondary | ICD-10-CM | POA: Diagnosis not present

## 2019-06-22 DIAGNOSIS — R200 Anesthesia of skin: Secondary | ICD-10-CM | POA: Diagnosis not present

## 2019-06-22 DIAGNOSIS — Z87891 Personal history of nicotine dependence: Secondary | ICD-10-CM | POA: Diagnosis not present

## 2019-06-22 DIAGNOSIS — Z833 Family history of diabetes mellitus: Secondary | ICD-10-CM | POA: Diagnosis not present

## 2019-06-22 DIAGNOSIS — Z9889 Other specified postprocedural states: Secondary | ICD-10-CM | POA: Diagnosis not present

## 2019-06-22 DIAGNOSIS — T148XXA Other injury of unspecified body region, initial encounter: Secondary | ICD-10-CM | POA: Diagnosis not present

## 2019-06-22 DIAGNOSIS — Z8249 Family history of ischemic heart disease and other diseases of the circulatory system: Secondary | ICD-10-CM | POA: Diagnosis not present

## 2019-06-22 DIAGNOSIS — X58XXXA Exposure to other specified factors, initial encounter: Secondary | ICD-10-CM | POA: Diagnosis not present

## 2019-06-22 NOTE — Progress Notes (Signed)
Written and verbal d/c instructions given and understanding voiced. 

## 2019-06-24 ENCOUNTER — Encounter: Payer: BC Managed Care – PPO | Admitting: Obstetrics & Gynecology

## 2019-06-26 ENCOUNTER — Telehealth: Payer: Self-pay

## 2019-06-26 NOTE — Telephone Encounter (Signed)
Pt called stating she had an "extreme headache" yesterday that has since gone away since taking Extra Strength Tylenol, pressure in her ears and her mouth is numb. She states "my mouth is numb like I've had Orajel or Lidocaine". Pt states her BP has not been elevated in the past few days. I spoke with Kathrene Alu, RN who recommends she go be seen at [facility]U. I advised pt to go to [facility]U and pt states she will try to find a babysitter so she can go.

## 2019-07-02 ENCOUNTER — Encounter: Payer: Self-pay | Admitting: Certified Nurse Midwife

## 2019-07-02 ENCOUNTER — Other Ambulatory Visit: Payer: Self-pay

## 2019-07-02 ENCOUNTER — Ambulatory Visit (INDEPENDENT_AMBULATORY_CARE_PROVIDER_SITE_OTHER): Payer: BC Managed Care – PPO | Admitting: Certified Nurse Midwife

## 2019-07-02 VITALS — BP 130/87 | HR 90 | Wt 276.0 lb

## 2019-07-02 DIAGNOSIS — Z98891 History of uterine scar from previous surgery: Secondary | ICD-10-CM

## 2019-07-02 NOTE — Progress Notes (Signed)
Ms. Katherine [name] is a [id number] who is s/p primary C/S for fetal malpresentation on 8/18. She is here today for an incision check and BP check. Incision C/D/I  No erythema, drainage, edema, or s/x of infection noted in any area along the incisional line. She reports pain well-controlled with ibuprofen. RTO in 2 wks for regular PP visit. Patient verbalized an understanding of the plan of care and agrees.   BP normal today in office  BP 130/87  Lajean Manes, MSN, CNM 07/02/2019 2:14 PM

## 2019-07-04 ENCOUNTER — Encounter: Payer: BC Managed Care – PPO | Admitting: Obstetrics & Gynecology

## 2019-07-16 ENCOUNTER — Telehealth (INDEPENDENT_AMBULATORY_CARE_PROVIDER_SITE_OTHER): Payer: BC Managed Care – PPO | Admitting: Certified Nurse Midwife

## 2019-07-16 ENCOUNTER — Encounter: Payer: Self-pay | Admitting: Certified Nurse Midwife

## 2019-07-16 ENCOUNTER — Other Ambulatory Visit: Payer: Self-pay

## 2019-07-16 DIAGNOSIS — F53 Postpartum depression: Secondary | ICD-10-CM

## 2019-07-16 DIAGNOSIS — Z98891 History of uterine scar from previous surgery: Secondary | ICD-10-CM

## 2019-07-16 MED ORDER — IBUPROFEN 800 MG PO TABS: 800.0000 mg | ORAL_TABLET | Freq: Four times a day (QID) | ORAL | 0 refills | Status: DC | PRN

## 2019-07-16 MED ORDER — SENNOSIDES 8.6 MG PO TABS: 1.0000 | ORAL_TABLET | Freq: Every day | ORAL | 0 refills | Status: DC

## 2019-07-16 MED ORDER — SERTRALINE HCL 50 MG PO TABS: 50.0000 mg | ORAL_TABLET | Freq: Every day | ORAL | 1 refills | Status: DC

## 2019-07-16 NOTE — Progress Notes (Signed)
TELEHEALTH POSTPARTUM VIRTUAL VIDEO VISIT ENCOUNTER NOTE   Provider location: Center for [employer] [facility] at [place]   I connected with [name] on 07/16/19 at  9:15 AM EDT by MyChart Video Encounter at home and verified that I am speaking with the correct person using two identifiers.    I discussed the limitations, risks, security and privacy concerns of performing an evaluation and management service virtually and the availability of in person appointments. I also discussed with the patient that there may be a patient responsible charge related to this service. The patient expressed understanding and agreed to proceed.  Chief Complaint: Postpartum Visit  History of Present Illness: Katherine [name] is a 38 y.o. Caucasian [id number] being evaluated for postpartum followup.    She is s/p primary cesarean section on 06/18/2019 at [redacted]w[redacted]d weeks; she was discharged to home on 8/20/20D#2. Pregnancy complicated by PPROM and malpresentation at time of delivery. Baby is doing well.  No complaints - requesting refill of Zoloft and Senakot.   Vaginal bleeding or discharge: spotting when wipes  Intercourse: No  Contraception: bilateral tubal ligation Mode of feeding infant: Breast PP depression s/s: Yes .  Any bowel or bladder issues: occassional constipation Pap smear: ASCUS with NEGATIVE high risk HPV (date: 12/2018)  Review of Systems: Her 12 point review of systems is negative or as noted in the History of Present Illness.  Patient Active Problem List   Diagnosis Date Noted  . Preterm premature rupture of membranes (PPROM) with unknown onset of labor 06/18/2019  . Rh negative state in antepartum period 06/18/2019  . Status post primary low transverse cesarean section 06/18/2019  . Preterm premature rupture of membranes 33w4 06/16/2019  . Abnormal chromosomal and genetic finding on antenatal screening mother 01/30/2019  . Supervision of other normal pregnancy,  antepartum 12/20/2018  . History of pre-eclampsia in prior pregnancy, currently pregnant 12/20/2018  . History of loop electrosurgical excision procedure (LEEP) 07/11/2016  . Postpartum depression 07/11/2016  . Preeclampsia, third trimester 03/11/2016  . AMA (advanced maternal age) multigravida 35+ 09/25/2015  . Mass of breast, left 08/30/2013  . Family history of DVT 08/20/2013  . Concentration deficit 08/12/2013  . GERD (gastroesophageal reflux disease) 01/22/2013  . Obesity 01/22/2013    Medications [name] "[name]" had no medications administered during this visit. Current Outpatient Medications  Medication Sig Dispense Refill  . ibuprofen (ADVIL) 800 MG tablet Take 1 tablet (800 mg total) by mouth every 6 (six) hours. 30 tablet 0  . Prenatal Vit-Fe Fumarate-FA (PRENATAL VITAMIN PO) Take by mouth.    . senna (SENOKOT) 8.6 MG tablet Take 1 tablet by mouth daily.     No current facility-administered medications for this visit.     Allergies Macrobid [nitrofurantoin]  Physical Exam:  There were no vitals taken for this visit. General:  Alert, oriented and cooperative. Patient is in no acute distress.  Mental Status: Normal mood and affect. Normal behavior. Normal judgment and thought content.   Respiratory: Normal respiratory effort noted, no problems with respiration noted  Rest of physical exam deferred due to type of encounter  PP Depression Screening:   Edinburgh Postnatal Depression Scale Screening Tool 07/16/2019 06/18/2019  I have been able to laugh and see the funny side of things. 0 1  I have looked forward with enjoyment to things. 0 1  I have blamed myself unnecessarily when things went wrong. 2 2  I have been anxious or worried for no good reason.  1 2  I have felt scared or panicky for no good reason. 1 1  Things have been getting on top of me. 0 1  I have been so unhappy that I have had difficulty sleeping. 0 0  I have felt sad or miserable. 0 1  I  have been so unhappy that I have been crying. 0 0  The thought of harming myself has occurred to me. 0 0  Edinburgh Postnatal Depression Scale Total 4 9     Assessment:Patient is a 38 y.o. [id number] who is 4 weeks postpartum from a primary cesarean section.  She is doing well.   Plan: 1. Postpartum care and examination - Normal postpartum examination  - Educated and discussed resume of IC after bleeding stops- patient verbalizes understanding  - senna (SENOKOT) 8.6 MG tablet; Take 1 tablet (8.6 mg total) by mouth daily.  Dispense: 30 tablet; Refill: 0 - ibuprofen (ADVIL) 800 MG tablet; Take 1 tablet (800 mg total) by mouth every 6 (six) hours as needed for moderate pain.  Dispense: 30 tablet; Refill: 0  2. S/P cesarean section - with BTL at time of delivery   3. Postpartum depression - Patient requesting refill of medication  - Educated on not taking zoloft and ibuprofen together or close to each other  - sertraline (ZOLOFT) 50 MG tablet; Take 1 tablet (50 mg total) by mouth daily.  Dispense: 60 tablet; Refill: 1  I discussed the assessment and treatment plan with the patient. The patient was provided an opportunity to ask questions and all were answered. The patient agreed with the plan and demonstrated an understanding of the instructions.   The patient was advised to call back or seek an in-person evaluation/go to the ED for any concerning postpartum symptoms.  I provided 15 minutes of face-to-face time during this encounter.   Sharyon CableVeronica C Falesha Schommer, CNM [facility], [facility]

## 2019-11-23 ENCOUNTER — Other Ambulatory Visit: Payer: Self-pay | Admitting: Certified Nurse Midwife

## 2019-11-26 ENCOUNTER — Other Ambulatory Visit: Payer: Self-pay | Admitting: Certified Nurse Midwife

## 2019-11-27 ENCOUNTER — Other Ambulatory Visit: Payer: Self-pay | Admitting: Certified Nurse Midwife

## 2020-08-05 DIAGNOSIS — I4510 Unspecified right bundle-branch block: Secondary | ICD-10-CM | POA: Diagnosis not present

## 2020-08-05 DIAGNOSIS — R0602 Shortness of breath: Secondary | ICD-10-CM | POA: Diagnosis not present

## 2020-08-05 DIAGNOSIS — R509 Fever, unspecified: Secondary | ICD-10-CM | POA: Diagnosis not present

## 2020-08-05 DIAGNOSIS — U071 COVID-19: Secondary | ICD-10-CM | POA: Diagnosis not present

## 2020-10-10 DIAGNOSIS — Z20822 Contact with and (suspected) exposure to covid-19: Secondary | ICD-10-CM | POA: Diagnosis not present

## 2020-12-30 DIAGNOSIS — F419 Anxiety disorder, unspecified: Secondary | ICD-10-CM | POA: Diagnosis not present

## 2021-02-10 IMAGING — US US MFM OB LIMITED
1 series · 15 of 25 positions shown · non-contrast
Comparison: none

[Series 1: us mfm ob limited · 15 of 25 slices shown]
[im 1/25]
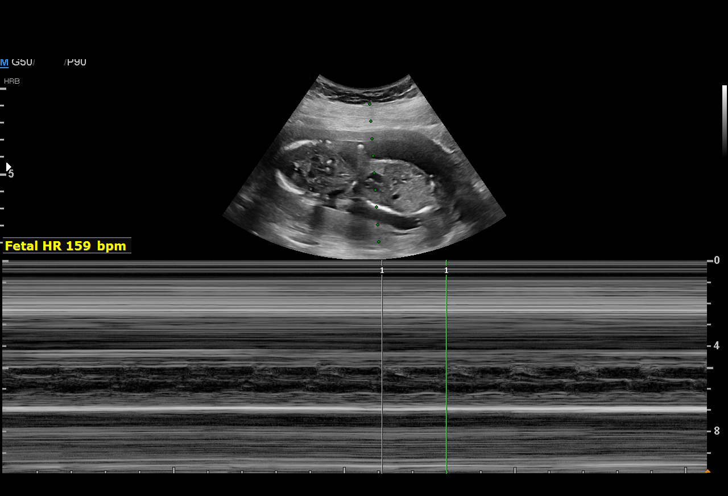
[im 3/25]
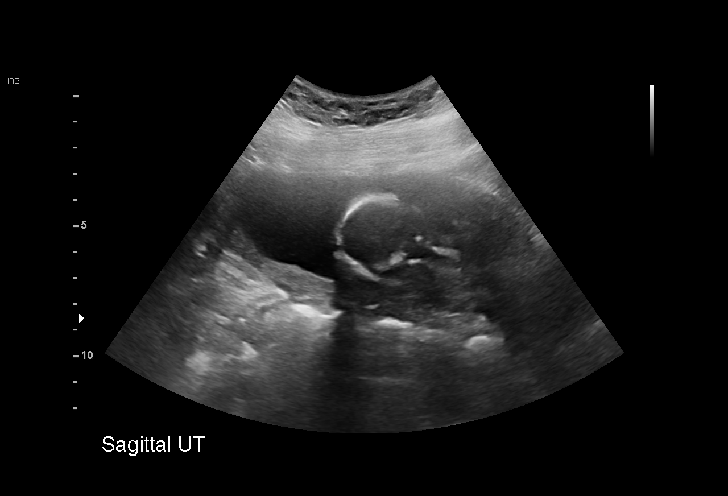
[im 5/25]
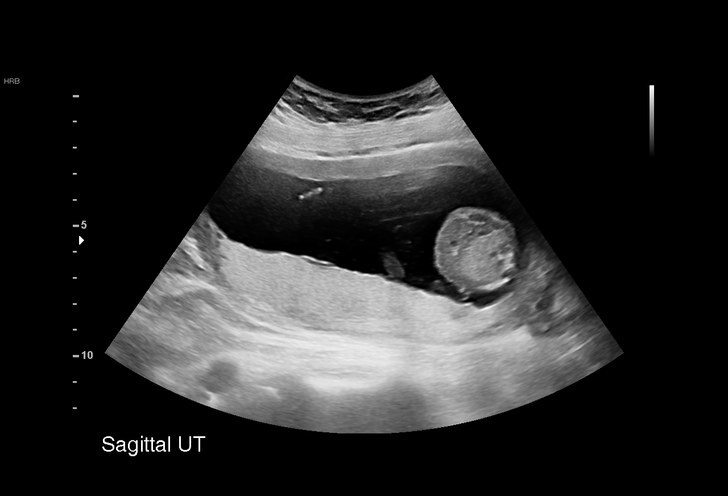
[im 6/25]
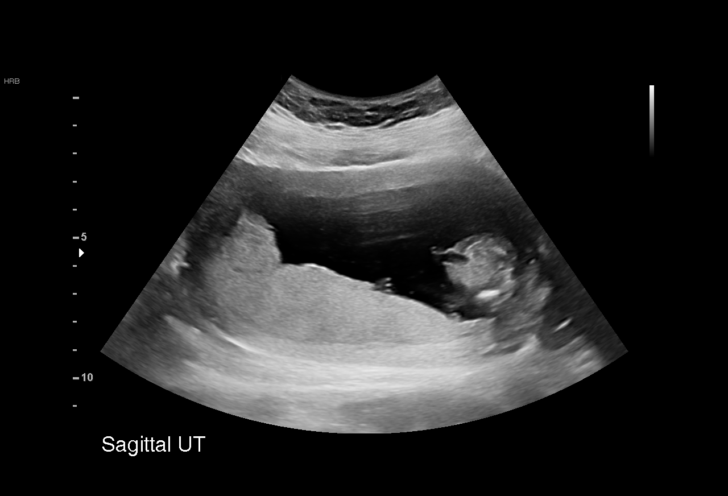
[im 8/25]
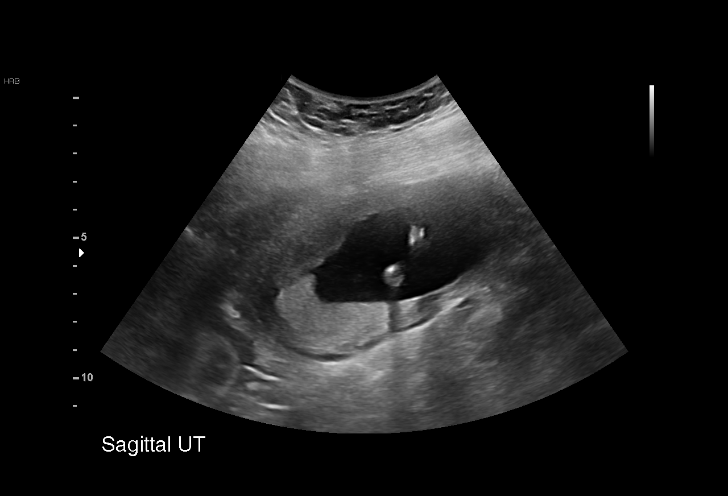
[im 10/25]
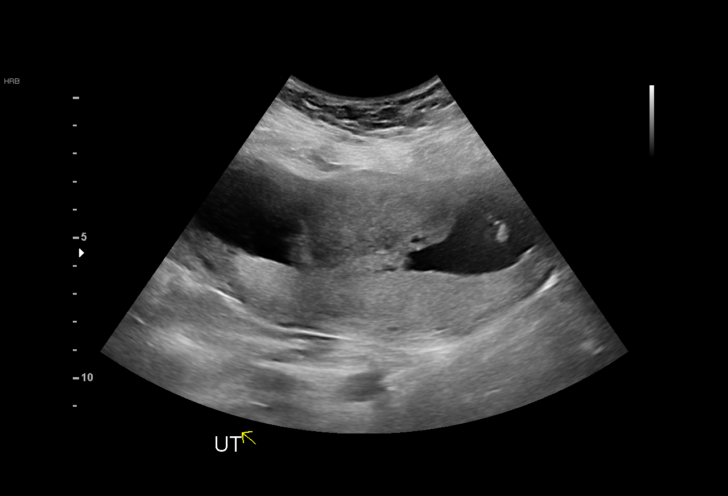
[im 11/25]
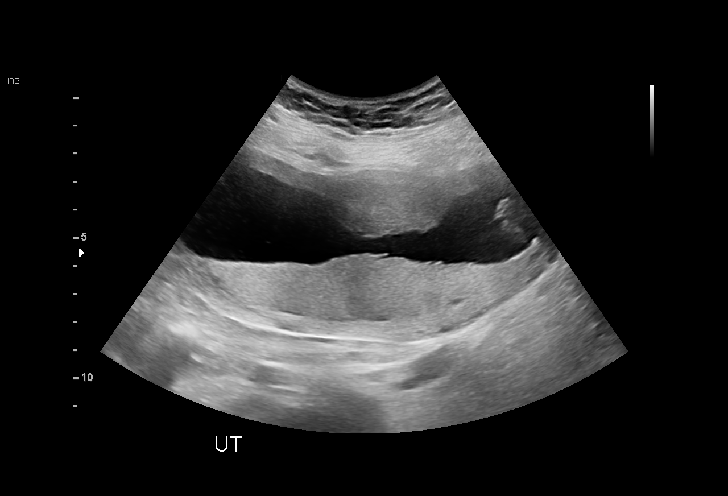
[im 13/25]
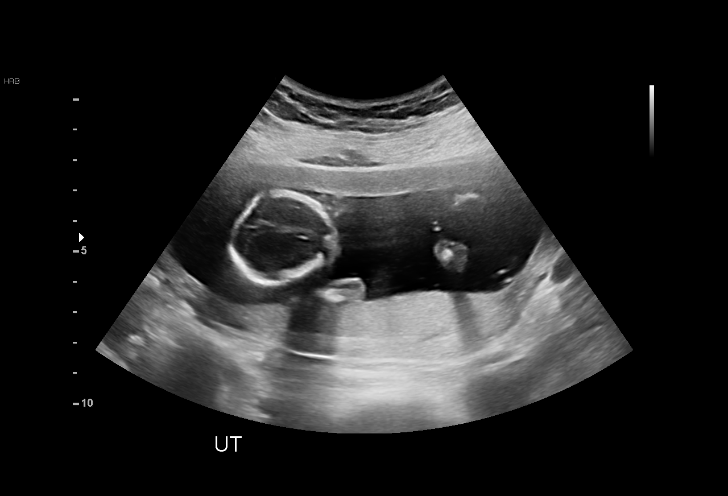
[im 15/25]
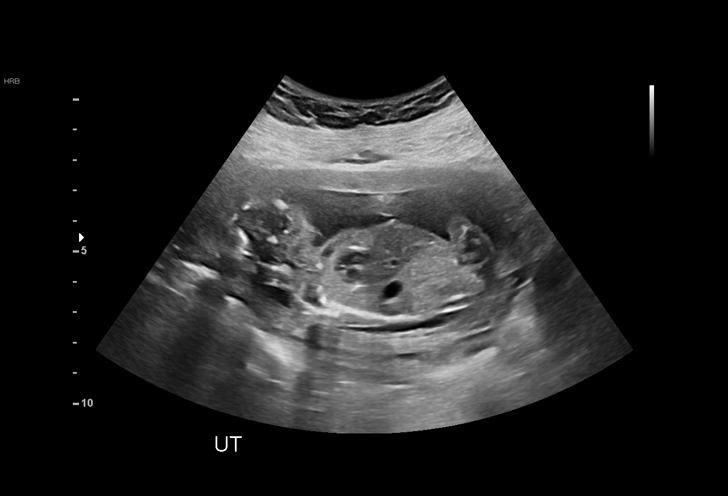
[im 16/25]
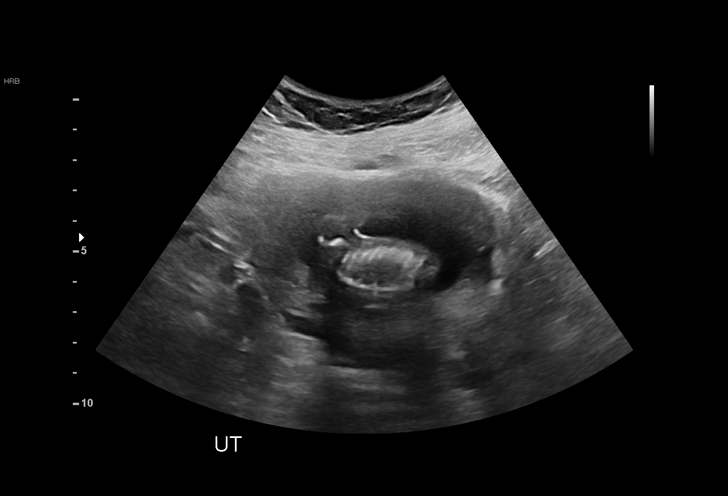
[im 18/25]
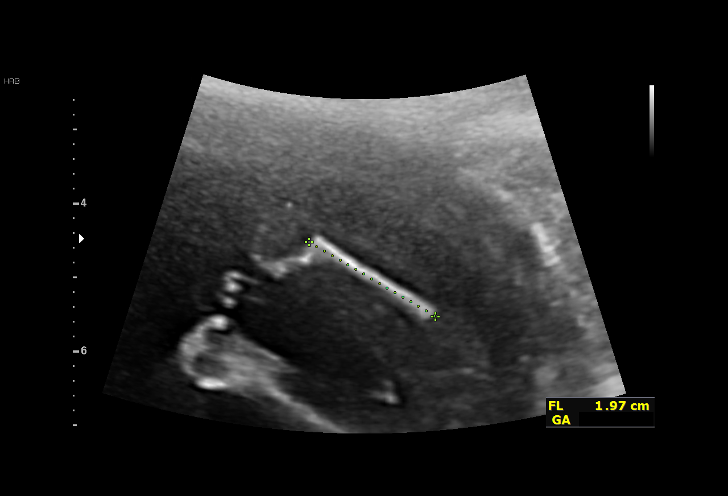
[im 20/25]
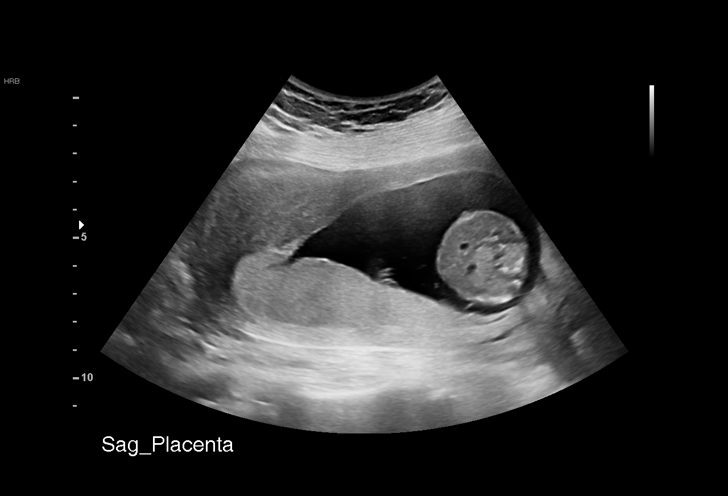
[im 21/25]
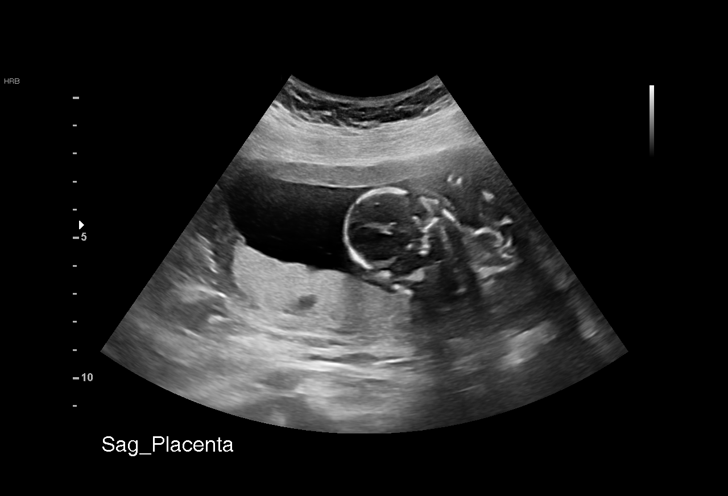
[im 23/25]
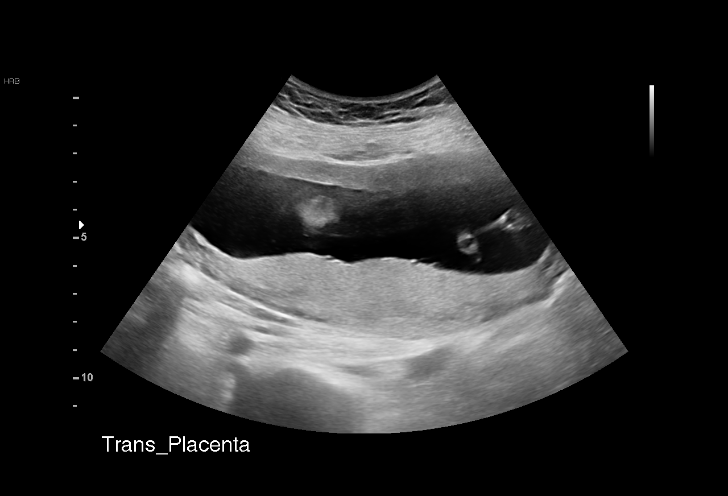
[im 25/25]
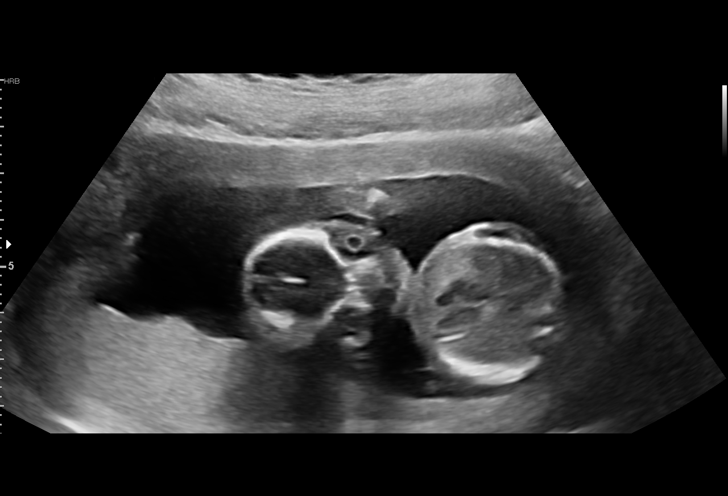

[15 of 25 positions shown; findings below may reference images not displayed]

1  US MFM OB LIMITED                    76815.01     GRULLON
                                                       JINAN
 ----------------------------------------------------------------------

 ----------------------------------------------------------------------
Indications

  Abnormal biochemical screen (low fetal
  fraction)
  Pregnancy with inconclusive fetal viability
  16 weeks gestation of pregnancy
 ----------------------------------------------------------------------
Vital Signs

 BMI:
Fetal Evaluation

 Num Of Fetuses:         1
 Fetal Heart Rate(bpm):  159
 Cardiac Activity:       Observed

 Amniotic Fluid
 AFI FV:      Within normal limits
Biometry

 BPD:      33.8  mm     G. Age:  16w 3d         63  %
                                                         FL/BPD:     58.3   %
 FL:       19.7  mm     G. Age:  15w 6d         34  %
OB History

 Gravidity:    4         Term:   1        Prem:   1
 Living:       3
Gestational Age

 LMP:           16w 1d        Date:  10/23/18                 EDD:   07/30/19
 U/S Today:     16w 1d                                        EDD:   07/30/19
 Best:          16w 1d     Det. By:  LMP  (10/23/18)          EDD:   07/30/19
Cervix Uterus Adnexa

 Cervix
 Normal appearance by transabdominal scan.

 Uterus
 No abnormality visualized.

 Left Ovary
 Not visualized. No adnexal mass visualized.

 Right Ovary
 Not visualized. No adnexal mass visualized.

 Cul De Sac
 No free fluid seen.

 Adnexa
 No abnormality visualized.
Impression

 Limited exam
 Increased risk cell free DNA with low fetal fraction
 Genetic counseling today
Recommendations

 Follow up for amniocentesis on [REDACTED] and detailed
 examination.

## 2021-05-21 IMAGING — US US MFM OB FOLLOW UP
1 series · 13 of 28 positions shown · non-contrast
Comparison: none

[Series 1: us mfm ob follow up · 13 of 41 slices shown]
[im 2/41]
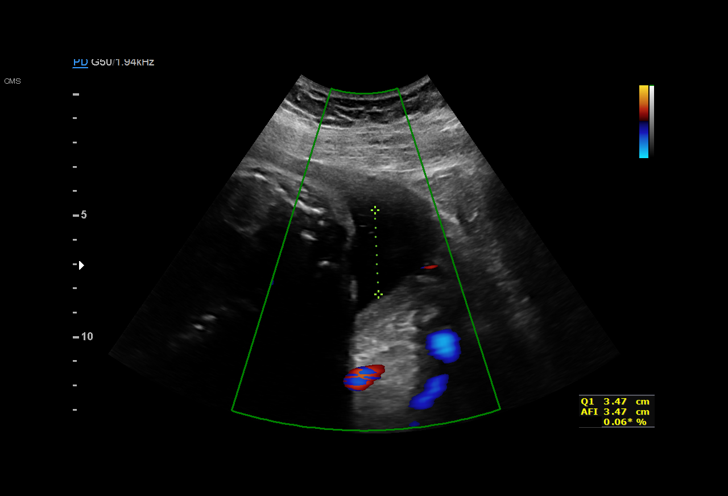
[im 5/41]
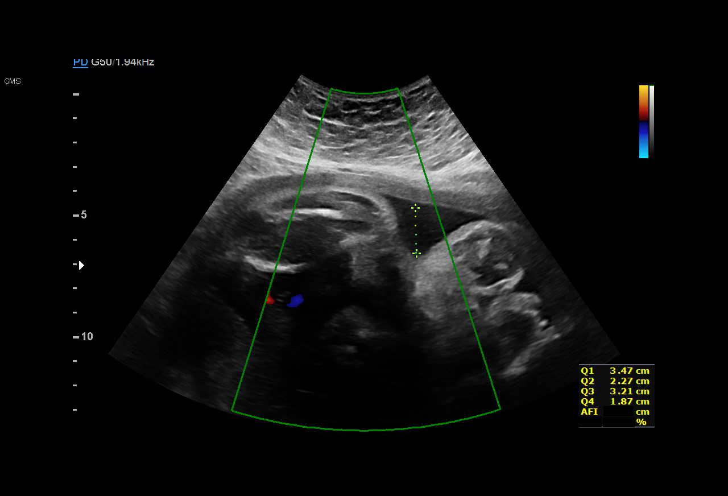
[im 8/41]
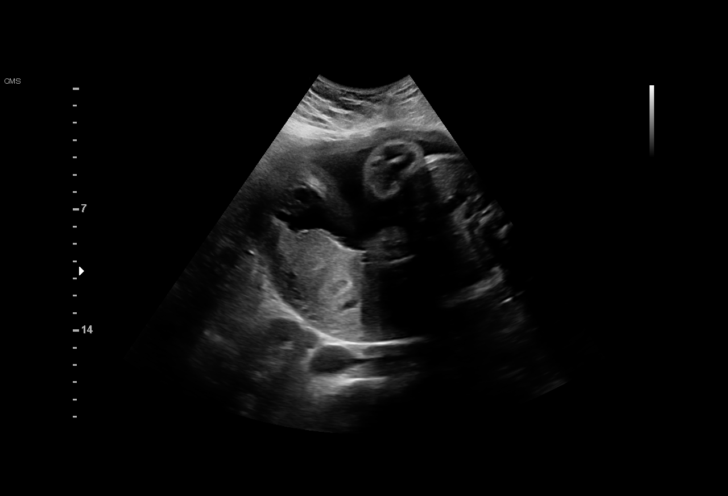
[im 11/41]
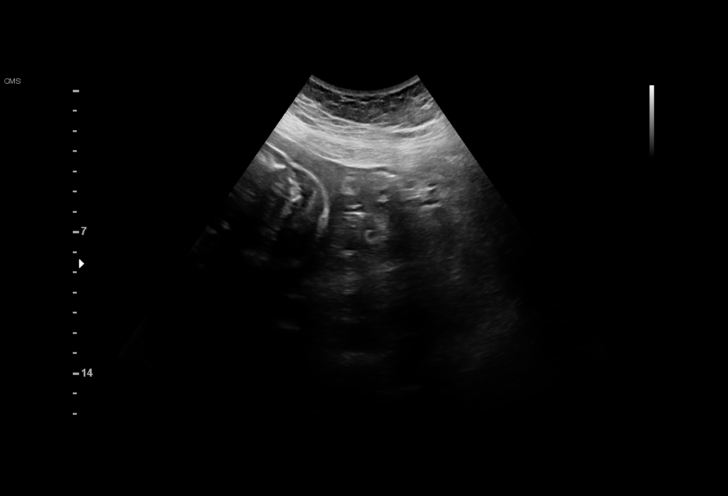
[im 14/41]
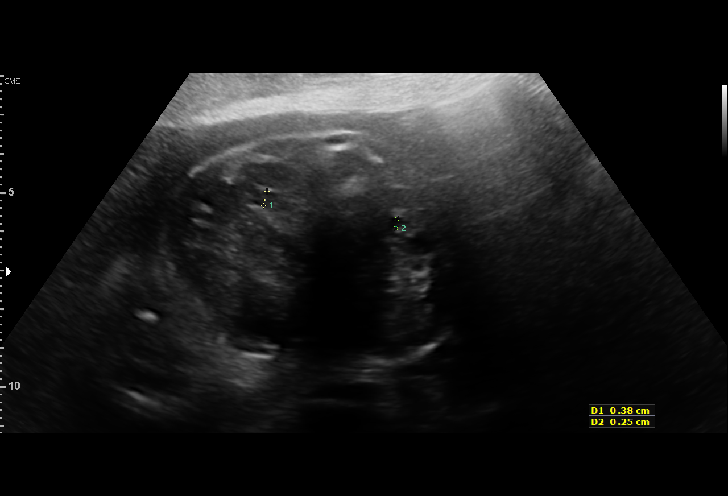
[im 17/41]
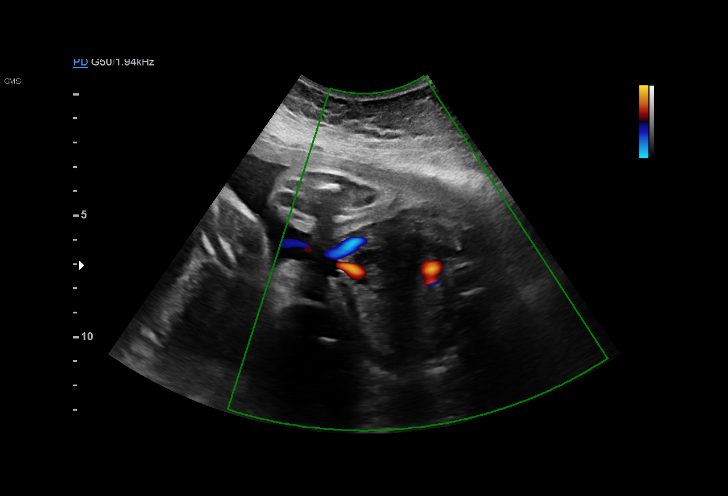
[im 21/41]
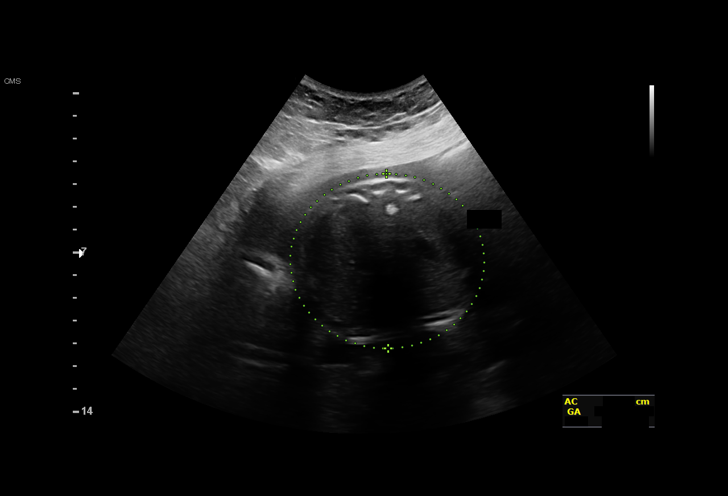
[im 24/41]
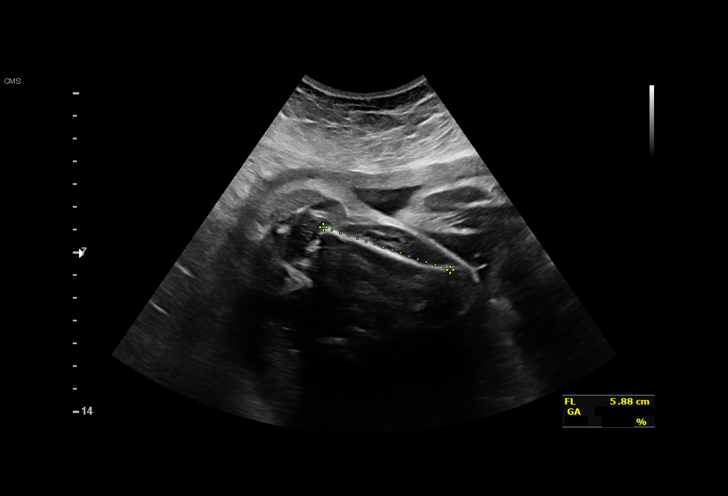
[im 27/41]
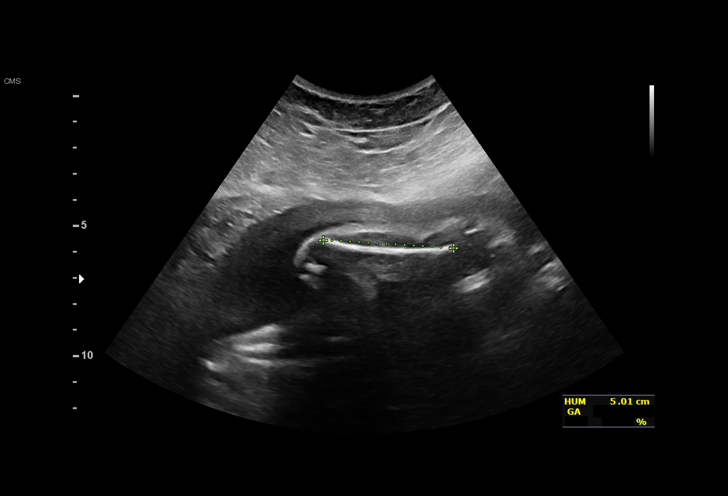
[im 30/41]
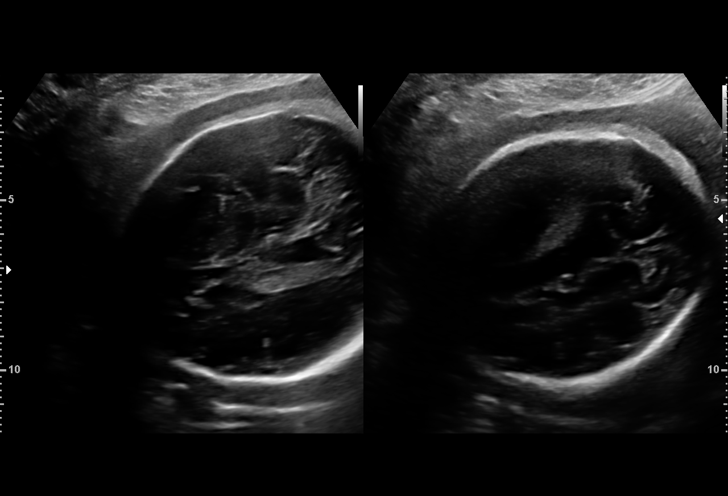
[im 33/41]
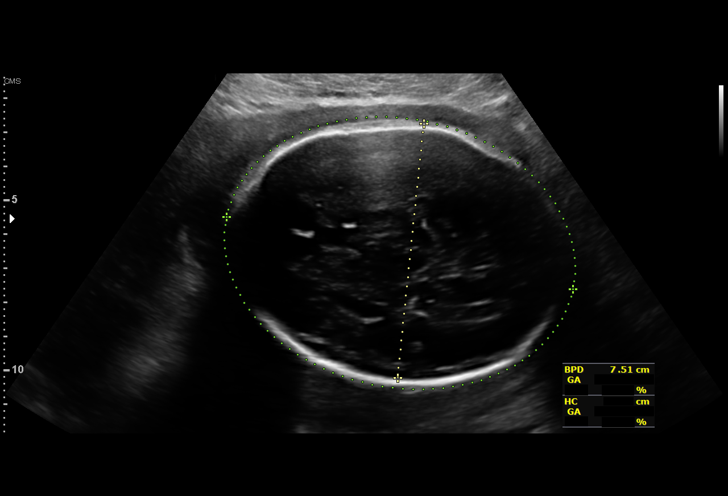
[im 36/41]
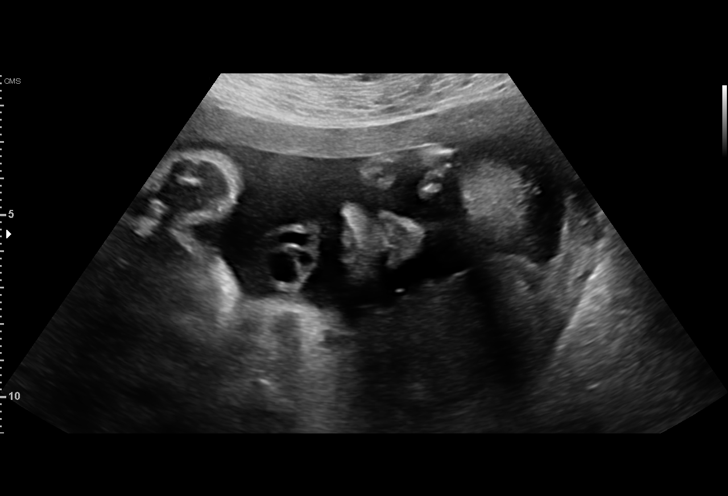
[im 39/41]
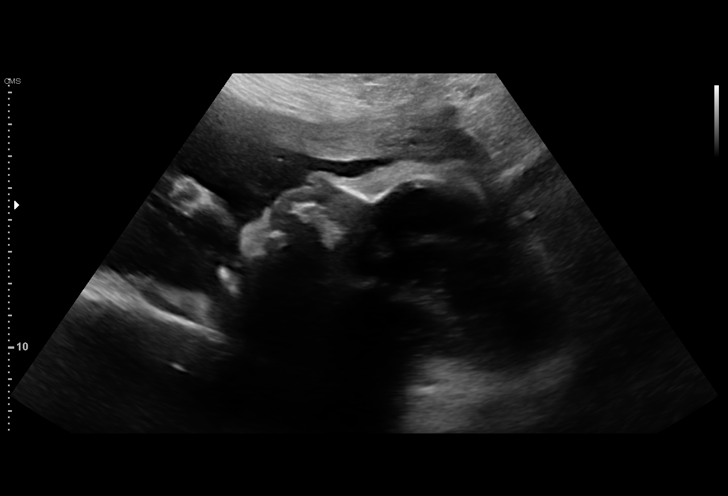

[13 of 28 positions shown; findings below may reference images not displayed]

----------------------------------------------------------------------

 ----------------------------------------------------------------------
Indications

  Encounter for other antenatal screening
  follow-up
  Obesity complicating pregnancy, third
  trimester
  Abnormal finding on antenatal screening
  (abnormal NIPS - low FF) Normal Amnio
  Advanced maternal age multigravida 35+,
  third trimester (40) - normal amnio
  Poor obstetric history: Previous
  preeclampsia / eclampsia/gestational HTN ,
  severe with 34 week preterm delivery, twin
  preg
  Poor obstetric history: Previous preterm
  delivery, antepartum for severe Pre-E
  30 weeks gestation of pregnancy
 ----------------------------------------------------------------------
Vital Signs

                                                Height:        5'9"
Fetal Evaluation

 Num Of Fetuses:          1
 Cardiac Activity:        Observed
 Presentation:            Cephalic
 Placenta:                Posterior
 P. Cord Insertion:       Previously Visualized

 Amniotic Fluid
 AFI FV:      Within normal limits

 AFI Sum(cm)     %Tile       Largest Pocket(cm)
 10.82           20

 RUQ(cm)       RLQ(cm)       LUQ(cm)        LLQ(cm)

Biometry

 BPD:        75  mm     G. Age:  30w 1d         28  %    CI:        69.37   %    70 - 86
                                                         FL/HC:       19.9  %    19.2 -
 HC:      287.5  mm     G. Age:  31w 4d         48  %    HC/AC:       1.13       0.99 -
 AC:      255.1  mm     G. Age:  29w 5d         24  %    FL/BPD:      76.4  %    71 - 87
 FL:       57.3  mm     G. Age:  30w 0d         25  %    FL/AC:       22.5  %    20 - 24
 HUM:      49.8  mm     G. Age:  29w 2d         27  %

 Est. FW:    9421   gm     3 lb 5 oz     24  %
OB History

 Gravidity:    4         Term:   1        Prem:   1
 Living:       3
Gestational Age

 LMP:           30w 3d        Date:  10/23/18                 EDD:   07/30/19
 U/S Today:     30w 3d                                        EDD:   07/30/19
 Best:          30w 3d     Det. By:  LMP  (10/23/18)          EDD:   07/30/19
Anatomy

 Cranium:               Appears normal         LVOT:                   Previously seen
 Cavum:                 Appears normal         Aortic Arch:            Previously seen
 Ventricles:            Appears normal         Ductal Arch:            Previously seen
 Choroid Plexus:        Appears normal         Diaphragm:              Previously seen
 Cerebellum:            Previously seen        Stomach:                Appears normal, left
                                                                       sided
 Posterior Fossa:       Previously seen        Abdomen:                Appears normal
 Nuchal Fold:           Previously seen        Abdominal Wall:         Previously seen
 Face:                  Orbits and profile     Cord Vessels:           Appears normal (3
                        previously seen                                vessel cord)
 Lips:                  Appears normal         Kidneys:                Appear normal
 Palate:                Limited views prev     Bladder:                Appears normal
                        visualized
 Thoracic:              Appears normal         Spine:                  Previously seen
 Heart:                 Previously seen        Upper Extremities:      Previously seen
 RVOT:                  Previously seen        Lower Extremities:      Previously seen

 Other:  5th digit prev visualized. Nasal bone visualized. Female gender
         previously seen. Technically difficult due to maternal habitus.
Cervix Uterus Adnexa

 Cervix
 Not visualized (advanced GA >95wks)

 Uterus
 No abnormality visualized.

 Left Ovary
 Not visualized.

 Right Ovary
 Not visualized.

 Cul De Sac
 No free fluid seen.
 Adnexa
 No abnormality visualized.
Impression

 Normal interval growth.
 AMA
Recommendations

 Follow up growth in 4 weeks.

## 2021-08-23 ENCOUNTER — Ambulatory Visit: Payer: BC Managed Care – PPO | Admitting: Obstetrics and Gynecology

## 2021-08-23 DIAGNOSIS — M545 Low back pain, unspecified: Secondary | ICD-10-CM | POA: Diagnosis not present

## 2021-08-30 ENCOUNTER — Encounter: Payer: Self-pay | Admitting: Obstetrics & Gynecology

## 2021-08-30 ENCOUNTER — Other Ambulatory Visit: Payer: Self-pay

## 2021-08-30 ENCOUNTER — Ambulatory Visit (INDEPENDENT_AMBULATORY_CARE_PROVIDER_SITE_OTHER): Payer: BC Managed Care – PPO | Admitting: Obstetrics & Gynecology

## 2021-08-30 VITALS — BP 137/75 | HR 88 | Wt 287.0 lb

## 2021-08-30 DIAGNOSIS — Z8759 Personal history of other complications of pregnancy, childbirth and the puerperium: Secondary | ICD-10-CM | POA: Diagnosis not present

## 2021-08-30 DIAGNOSIS — N3946 Mixed incontinence: Secondary | ICD-10-CM

## 2021-08-30 DIAGNOSIS — F341 Dysthymic disorder: Secondary | ICD-10-CM

## 2021-08-30 DIAGNOSIS — Z0000 Encounter for general adult medical examination without abnormal findings: Secondary | ICD-10-CM

## 2021-08-30 MED ORDER — SERTRALINE HCL 100 MG PO TABS: 100.0000 mg | ORAL_TABLET | Freq: Every day | ORAL | 1 refills | Status: AC

## 2021-08-30 NOTE — Progress Notes (Signed)
GAD Score: 5 PHQ 9: 12

## 2021-08-30 NOTE — Progress Notes (Deleted)
Subjective:     Katherine [name] is a 40 y.o. female here for a routine exam.  Current complaints: ***.  Personal health questionnaire reviewed: {yes/no:9010}.   Gynecologic History No LMP recorded. Contraception: {method:5051} Last Pap: ***. Results were: {norm/abn:16337} Last mammogram: ***. Results were: {norm/abn:16337}  Obstetric History OB History  Gravida Para Term Preterm AB Living  4 3 1 2 1 4   SAB IAB Ectopic Multiple Live Births    1   1 4     # Outcome Date GA Lbr Len/2nd Weight Sex Delivery Anes PTL Lv  4 Preterm 06/18/19 [redacted]w[redacted]d  3 lb 12.7 oz (1.72 kg) F CS-LTranv EPI  LIV  3A Preterm 03/15/16 [redacted]w[redacted]d 07:00 / 00:18 5 lb 6.1 oz (2.44 kg) M Vag-Spont EPI  LIV  3B Preterm 03/15/16 [redacted]w[redacted]d 07:00 / 00:23 4 lb 9.4 oz (2.08 kg) F Vag-Spont EPI  LIV  2 Term 11/18/00 [redacted]w[redacted]d   F Vag-Spont   LIV  1 IAB              {Common ambulatory SmartLinks:19316}  Review of Systems {ros; complete:30496}    Objective:    {exam; complete:18323}    Assessment:    Healthy female exam.    Plan:    {plan:19193}

## 2021-08-30 NOTE — Progress Notes (Signed)
   Subjective:    Patient ID: Katherine [name], female    DOB: 04/07/1981, 40 y.o.   MRN: [id number]  HPI  40 year old female presents for exam and discussion about urinary incontinence and weight loss.  Patient was supposed to have her yearly exam today; however, she experienced a back injury and is unable to get in dorsolithotomy position for an exam.  She had 1 to keep this appointment to talk about these issues that are very important to her.  She is having many episodes a day of stress incontinence.  Laughing jumping coughing large movements can go lead to incontinence.  Urine dip was negative today.  Patient has not tried Kegel exercises yet.  Patient also very frustrated with her inability to lose weight.  She had tried keto after her twins and lost 80 pounds.  She gained weight during her pregnancy and has not lost any since the birth of her child.  She is very sad about this.  Although not complaining of depression.  She tried keto again but led to extreme constipation so she stopped.  She would like to try weight loss or Noom.  She would also like medications to help her as she has buffet behavioral modifications for her weight loss.  Patient does not have a PCP at this time.  Patient has had an recent normal TSH at her work.  Review of Systems  Constitutional: Negative.   Respiratory: Negative.    Cardiovascular: Negative.   Gastrointestinal: Negative.   Genitourinary: Negative.       Objective:   Physical Exam Vitals reviewed.  Constitutional:      General: She is not in acute distress.    Appearance: She is well-developed.  HENT:     Head: Normocephalic and atraumatic.  Eyes:     Conjunctiva/sclera: Conjunctivae normal.  Cardiovascular:     Rate and Rhythm: Normal rate.  Pulmonary:     Effort: Pulmonary effort is normal.  Skin:    General: Skin is warm and dry.  Neurological:     Mental Status: She is alert and oriented to person, place, and time.  Psychiatric:         Mood and Affect: Mood normal.   Vitals:   08/30/21 1124  BP: 137/75  Pulse: 88  Weight: 287 lb (130.2 kg)       Assessment & Plan:  40 year old female with morbid obesity and urinary incontinence  Will try weight loss or Noom to help with calorie counting and understanding her stressors and relationship with food. Patient needs a PCP and will choose one that does weight loss management.  I suggested she try a provider in our building down the hallway. Patient needs mammogram and pap smear GAD = 5 (mild) and PHQ 9 (mod); will increase zoloft to 100 mg day.  Pt thinks she is more irritable and hoping zoloft dose increase will help. Extensive counseling around the psychology of weight loss, metabolic effects, and strategies to be successful.  45 minutes spent with patient during exam and counseling.  As noted above there was extensive counseling around her history of weight loss, family pressures and stigma, stress, food choices, etc..Marland Kitchen

## 2021-12-13 DIAGNOSIS — E669 Obesity, unspecified: Secondary | ICD-10-CM | POA: Diagnosis not present

## 2022-01-10 DIAGNOSIS — E669 Obesity, unspecified: Secondary | ICD-10-CM | POA: Diagnosis not present

## 2022-02-08 DIAGNOSIS — Z1231 Encounter for screening mammogram for malignant neoplasm of breast: Secondary | ICD-10-CM | POA: Diagnosis not present

## 2022-02-10 DIAGNOSIS — E669 Obesity, unspecified: Secondary | ICD-10-CM | POA: Diagnosis not present

## 2022-02-14 ENCOUNTER — Encounter: Payer: Self-pay | Admitting: *Deleted

## 2022-02-28 ENCOUNTER — Encounter: Payer: Self-pay | Admitting: *Deleted

## 2022-02-28 ENCOUNTER — Encounter: Payer: Self-pay | Admitting: Obstetrics & Gynecology

## 2022-02-28 DIAGNOSIS — E8881 Metabolic syndrome: Secondary | ICD-10-CM | POA: Insufficient documentation

## 2022-02-28 NOTE — Progress Notes (Signed)
Mammogram scanned into chart.

## 2022-03-12 DIAGNOSIS — E669 Obesity, unspecified: Secondary | ICD-10-CM | POA: Diagnosis not present

## 2022-04-12 DIAGNOSIS — E669 Obesity, unspecified: Secondary | ICD-10-CM | POA: Diagnosis not present

## 2022-05-12 DIAGNOSIS — E669 Obesity, unspecified: Secondary | ICD-10-CM | POA: Diagnosis not present

## 2022-07-22 DIAGNOSIS — M545 Low back pain, unspecified: Secondary | ICD-10-CM | POA: Diagnosis not present

## 2022-07-22 DIAGNOSIS — M62830 Muscle spasm of back: Secondary | ICD-10-CM | POA: Diagnosis not present

## 2022-07-22 DIAGNOSIS — S39012A Strain of muscle, fascia and tendon of lower back, initial encounter: Secondary | ICD-10-CM | POA: Diagnosis not present

## 2022-08-15 DIAGNOSIS — M25572 Pain in left ankle and joints of left foot: Secondary | ICD-10-CM | POA: Diagnosis not present
